# Patient Record
Sex: Female | Born: 1995 | Race: Black or African American | Hispanic: No | Marital: Single | Smoking: Never smoker
Health system: Southern US, Community
[De-identification: ages and names within clinical notes are randomized; demographics above are authoritative.]

## PROBLEM LIST (undated history)

## (undated) DIAGNOSIS — E119 Type 2 diabetes mellitus without complications: Secondary | ICD-10-CM

## (undated) DIAGNOSIS — I10 Essential (primary) hypertension: Secondary | ICD-10-CM

## (undated) HISTORY — DX: Type 2 diabetes mellitus without complications: E11.9

---
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
## 2015-06-27 ENCOUNTER — Encounter (HOSPITAL_COMMUNITY): Payer: Self-pay | Admitting: Family Medicine

## 2015-06-27 ENCOUNTER — Emergency Department (HOSPITAL_COMMUNITY)
Admission: EM | Admit: 2015-06-27 | Discharge: 2015-06-27 | Disposition: A | Discharge status: Home / Self Care | Payer: Medicaid Other | Attending: Emergency Medicine | Admitting: Emergency Medicine

## 2015-06-27 DIAGNOSIS — Z3202 Encounter for pregnancy test, result negative: Secondary | ICD-10-CM | POA: Insufficient documentation

## 2015-06-27 DIAGNOSIS — L80 Vitiligo: Secondary | ICD-10-CM | POA: Insufficient documentation

## 2015-06-27 DIAGNOSIS — R000 Tachycardia, unspecified: Secondary | ICD-10-CM | POA: Insufficient documentation

## 2015-06-27 DIAGNOSIS — D649 Anemia, unspecified: Secondary | ICD-10-CM | POA: Insufficient documentation

## 2015-06-27 DIAGNOSIS — R739 Hyperglycemia, unspecified: Secondary | ICD-10-CM | POA: Diagnosis not present

## 2015-06-27 DIAGNOSIS — J029 Acute pharyngitis, unspecified: Secondary | ICD-10-CM | POA: Diagnosis present

## 2015-06-27 LAB — CBC WITH DIFFERENTIAL/PLATELET
BASOS ABS: 0 10*3/uL (ref 0.0–0.1)
Basophils Relative: 0 %
Eosinophils Absolute: 0.1 10*3/uL (ref 0.0–0.7)
Eosinophils Relative: 1 %
HEMATOCRIT: 33 % — AB (ref 36.0–46.0)
Hemoglobin: 9.6 g/dL — ABNORMAL LOW (ref 12.0–15.0)
LYMPHS ABS: 1.7 10*3/uL (ref 0.7–4.0)
LYMPHS PCT: 35 %
MCH: 17.3 pg — ABNORMAL LOW (ref 26.0–34.0)
MCHC: 29.1 g/dL — ABNORMAL LOW (ref 30.0–36.0)
MCV: 59.6 fL — ABNORMAL LOW (ref 78.0–100.0)
MONOS PCT: 8 %
Monocytes Absolute: 0.4 10*3/uL (ref 0.1–1.0)
NEUTROS PCT: 56 %
Neutro Abs: 2.7 10*3/uL (ref 1.7–7.7)
Platelets: 274 10*3/uL (ref 150–400)
RBC: 5.54 MIL/uL — AB (ref 3.87–5.11)
RDW: 20 % — AB (ref 11.5–15.5)
WBC: 4.9 10*3/uL (ref 4.0–10.5)

## 2015-06-27 LAB — BASIC METABOLIC PANEL
ANION GAP: 15 (ref 5–15)
BUN: 9 mg/dL (ref 6–20)
CALCIUM: 9.9 mg/dL (ref 8.9–10.3)
CO2: 24 mmol/L (ref 22–32)
Chloride: 100 mmol/L — ABNORMAL LOW (ref 101–111)
Creatinine, Ser: 0.86 mg/dL (ref 0.44–1.00)
Glucose, Bld: 334 mg/dL — ABNORMAL HIGH (ref 65–99)
POTASSIUM: 3.9 mmol/L (ref 3.5–5.1)
Sodium: 139 mmol/L (ref 135–145)

## 2015-06-27 LAB — URINE MICROSCOPIC-ADD ON

## 2015-06-27 LAB — URINALYSIS, ROUTINE W REFLEX MICROSCOPIC
Bilirubin Urine: NEGATIVE
Glucose, UA: >1000 mg/dL — AB
KETONES UR: 40 mg/dL — AB
LEUKOCYTES UA: NEGATIVE
NITRITE: NEGATIVE
PROTEIN: 100 mg/dL — AB
Specific Gravity, Urine: >1.046 — ABNORMAL HIGH (ref 1.005–1.030)
pH: 6 (ref 5.0–8.0)

## 2015-06-27 LAB — HEPATIC FUNCTION PANEL
ALT: 20 U/L (ref 14–54)
AST: 29 U/L (ref 15–41)
Albumin: 4.4 g/dL (ref 3.5–5.0)
Alkaline Phosphatase: 132 U/L — ABNORMAL HIGH (ref 38–126)
BILIRUBIN DIRECT: 0.1 mg/dL (ref 0.1–0.5)
BILIRUBIN INDIRECT: 0.7 mg/dL (ref 0.3–0.9)
Total Bilirubin: 0.8 mg/dL (ref 0.3–1.2)
Total Protein: 8.9 g/dL — ABNORMAL HIGH (ref 6.5–8.1)

## 2015-06-27 LAB — CBG MONITORING, ED: Glucose-Capillary: 242 mg/dL — ABNORMAL HIGH (ref 65–99)

## 2015-06-27 LAB — I-STAT BETA HCG BLOOD, ED (MC, WL, AP ONLY): I-stat hCG, quantitative: <5 m[IU]/mL (ref <5)

## 2015-06-27 LAB — RAPID STREP SCREEN (MED CTR MEBANE ONLY): Streptococcus, Group A Screen (Direct): NEGATIVE

## 2015-06-27 LAB — MONONUCLEOSIS SCREEN: MONO SCREEN: NEGATIVE

## 2015-06-27 MED ORDER — SODIUM CHLORIDE 0.9 % IV BOLUS (SEPSIS)
1000.0000 mL | Freq: Once | INTRAVENOUS | Status: AC
  Administered 2015-06-27: 1000 mL via INTRAVENOUS

## 2015-06-27 MED ORDER — FERROUS SULFATE 325 (65 FE) MG PO TABS: 325.0000 mg | ORAL_TABLET | Freq: Three times a day (TID) | ORAL | Status: DC

## 2015-06-27 MED ORDER — FLUCONAZOLE 100 MG PO TABS
200.0000 mg | ORAL_TABLET | Freq: Once | ORAL | Status: AC
  Administered 2015-06-27: 200 mg via ORAL
  Filled 2015-06-27: qty 2

## 2015-06-27 MED ORDER — METFORMIN HCL 500 MG PO TABS: 500.0000 mg | ORAL_TABLET | Freq: Two times a day (BID) | ORAL | Status: DC

## 2015-06-27 NOTE — Care Management Note (Signed)
Case Management Note  Patient Details  Name: Morgan Moon MRN: [id number] Date of Birth: October 09, 1995  Subjective/Objective:           [facility]. Incidental findings elevated blood sugar.  Action/Plan:  Patient is without PCP for follow up. Patient has [place] Medicaid ,[facility] at 2[facility]. Teach back done, patient verbalized understanding. No further questions or concerns verbalized.   Expected Discharge Date:     06/27/15             Expected Discharge Plan:   [facility]   In-House Referral:     Discharge planning Services   Indigent clinic  Post Acute Care Choice:    Choice offered to:     DME Arranged:    DME Agency:     HH Arranged:    HH Agency:     Status of Service:   completed and signed off  Medicare Important Message Given:    Date Medicare IM Given:    Medicare IM give by:    Date Additional Medicare IM Given:    Additional Medicare Important Message give by:     If discussed at Long Length of Stay Meetings, dates discussed:    Additional CommentsMichel Moon:  Morgan Weimer, RN 06/27/2015, 3:44 PM

## 2015-06-27 NOTE — ED Notes (Signed)
Wanda, case manager, at bedside  

## 2015-06-27 NOTE — Discharge Instructions (Signed)
Please follow with your primary care doctor in the next 2 days for a check-up. They must obtain records for further management.  ° °Do not hesitate to return to the Emergency Department for any new, worsening or concerning symptoms.  ° °

## 2015-06-27 NOTE — ED Notes (Signed)
Pt here for sore throat for a few days. Throat painful and red. sts vomited once Thursday. Pt HR elevated in triage. Mom sts hx of anxiety.

## 2015-06-27 NOTE — ED Provider Notes (Signed)
CSN: [id number]     Arrival date & time 06/27/15  1128 History   First MD Initiated Contact with Patient 06/27/15 1206     Chief Complaint  Patient presents with  . Sore Throat     (Consider location/radiation/quality/duration/timing/severity/associated sxs/prior Treatment) HPI  Blood pressure 142/91, pulse 130, temperature 98.6 F (37 C), temperature source Oral, resp. rate 18, weight 54.205 kg, last menstrual period 06/21/2015, SpO2 100 %.  Morgan Moon is a 20 y.o. female complaining of sore throat onset 3 days ago, she rates it as severe, no pain medication taken prior to arrival. Mother thought it might be allergies and gave her some generic allergy medication with little relief. Patient denies rhinorrhea, otalgia, sinus pain pressure, fever, chills, cough, chest pain, shortness of breath, abdominal pain, change in bowel or bladder habits, rash, sick contacts. Patient also reports single episode of nonbloody, nonbilious, no coffee-ground emesis 2 days ago now resolved.  History reviewed. No pertinent past medical history. History reviewed. No pertinent past surgical history. History reviewed. No pertinent family history. Social History  Substance Use Topics  . Smoking status: Never Smoker   . Smokeless tobacco: None  . Alcohol Use: No   OB History    No data available     Review of Systems  10 systems reviewed and found to be negative, except as noted in the HPI.  Allergies  Review of patient's allergies indicates no known allergies.  Home Medications   Prior to Admission medications   Medication Sig Start Date End Date Taking? Authorizing Provider  ferrous sulfate 325 (65 FE) MG tablet Take 1 tablet (325 mg total) by mouth 3 (three) times daily with meals. 06/27/15   Rosina Cressler, PA-C  metFORMIN (GLUCOPHAGE) 500 MG tablet Take 1 tablet (500 mg total) by mouth 2 (two) times daily with a meal. 06/27/15   Brooklyn Jeff, PA-C   BP 145/96 mmHg  Pulse 109  Temp(Src)  98.6 F (37 C) (Oral)  Resp 15  Wt 54.205 kg  SpO2 100%  LMP 06/21/2015 Physical Exam  Constitutional: She is oriented to person, place, and time. She appears well-developed and well-nourished. No distress.  HENT:  Head: Normocephalic and atraumatic.  Right Ear: External ear normal.  Left Ear: External ear normal.  Mouth/Throat: Oropharynx is clear and moist. No oropharyngeal exudate.  No drooling or stridor. Posterior pharynx mildly erythematous no significant tonsillar hypertrophy. No exudate. Soft palate rises symmetrically. No TTP or induration under tongue.   No tenderness to palpation of frontal or bilateral maxillary sinuses.  Mild mucosal edema in the nares with scant rhinorrhea.  Bilateral tympanic membranes with normal architecture and good light reflex.    Eyes: Conjunctivae and EOM are normal. Pupils are equal, round, and reactive to light.  Neck: Normal range of motion. Neck supple.  Cardiovascular: Regular rhythm and intact distal pulses.   Mild tachycardia  Pulmonary/Chest: Effort normal and breath sounds normal. No stridor. No respiratory distress. She has no wheezes. She has no rales. She exhibits no tenderness.  Abdominal: Soft. There is no tenderness. There is no rebound and no guarding.  Musculoskeletal: Normal range of motion.  Neurological: She is alert and oriented to person, place, and time.  Skin: She is not diaphoretic.  Vitiligo  Psychiatric: She has a normal mood and affect.  Nursing note and vitals reviewed.   ED Course  Procedures (including critical care time) Labs Review Labs Reviewed  CBC WITH DIFFERENTIAL/PLATELET - Abnormal; Notable for the following:  RBC 5.54 (*)    Hemoglobin 9.6 (*)    HCT 33.0 (*)    MCV 59.6 (*)    MCH 17.3 (*)    MCHC 29.1 (*)    RDW 20.0 (*)    All other components within normal limits  BASIC METABOLIC PANEL - Abnormal; Notable for the following:    Chloride 100 (*)    Glucose, Bld 334 (*)    All other  components within normal limits  HEPATIC FUNCTION PANEL - Abnormal; Notable for the following:    Total Protein 8.9 (*)    Alkaline Phosphatase 132 (*)    All other components within normal limits  URINALYSIS, ROUTINE W REFLEX MICROSCOPIC (NOT AT [facility]) - Abnormal; Notable for the following:    Specific Gravity, Urine >1.046 (*)    Glucose, UA >1000 (*)    Hgb urine dipstick TRACE (*)    Ketones, ur 40 (*)    Protein, ur 100 (*)    All other components within normal limits  URINE MICROSCOPIC-ADD ON - Abnormal; Notable for the following:    Squamous Epithelial / LPF 0-5 (*)    Bacteria, UA RARE (*)    Casts GRANULAR CAST (*)    All other components within normal limits  CBG MONITORING, ED - Abnormal; Notable for the following:    Glucose-Capillary 242 (*)    All other components within normal limits  RAPID STREP SCREEN (NOT AT [facility])  CULTURE, GROUP A STREP ([facility])  MONONUCLEOSIS SCREEN  HEMOGLOBIN A1C  I-STAT BETA HCG BLOOD, ED ([facility], [facility], [facility] ONLY)    Imaging Review No results found. I have personally reviewed and evaluated these images and lab results as part of my medical decision-making.   EKG Interpretation   Date/Time:  Sunday June 27 2015 13:42:00 EDT Ventricular Rate:  110 PR Interval:  138 QRS Duration: 83 QT Interval:  321 QTC Calculation: 434 R Axis:   61 Text Interpretation:  Sinus tachycardia Abnormal T, consider ischemia,  diffuse leads Baseline wander in lead(s) V3 V4 V5 No previous ECGs  available Confirmed by YAO  MD, DAVID ([id number]) on 06/27/2015 2:01:45 PM Also  confirmed by Silverio Lay  MD, DAVID ([id number]), editor Dan Humphreys, CCT, SANDRA [phone])   on 06/27/2015 2:45:36 PM      MDM   Final diagnoses:  Hyperglycemia without ketosis  Anemia, unspecified anemia type  Pharyngitis    Filed Vitals:   06/27/15 1315 06/27/15 1345 06/27/15 1400 06/27/15 1415  BP: 142/90 149/96 114/72 145/96  Pulse: 109 110 106 109  Temp:      TempSrc:      Resp:  Weight:       SpO2: 100% 100% 100% 100%    Medications  fluconazole (DIFLUCAN) tablet 200 mg (not administered)  sodium chloride 0.9 % bolus 1,000 mL (0 mLs Intravenous Stopped 06/27/15 1321)    Morgan Moon is 20 y.o. female presenting with Isolated sore throat onset 2 days ago nonresponsive to allergy medications. Patient with no tonsillar hypertrophy, handling her secretions without issue.   This patient's blood glucose is elevated at 334, she has a normal anion gap. On discussions with this patient she does have a polyuria and polydipsia. She is also anemic with a hemoglobin of 9.6/33. We'll draw an A1c to be followed up by her primary care physician It appears that she has very heavy menses. Patient does not have outpatient primary care provider, case management consult in place to arrange this.  As per patient's mother she has a developmental delay and operates at the third grade level. Will check a urinalysis, think her urinary frequency is likely just secondary to her hyperglycemia.  Patient's urinalysis without signs of infection however, it is quite concentrated with 40 ketones and yeast is present. She given 200 mg of 2, surgical, she will follow with  Evaluation does not show pathology that would require ongoing emergent intervention or inpatient treatment. Pt is hemodynamically stable and mentating appropriately. Discussed findings and plan with patient/guardian, who agrees with care plan. All questions answered. Return precautions discussed and outpatient follow up given.   New Prescriptions   FERROUS SULFATE 325 (65 FE) MG TABLET    Take 1 tablet (325 mg total) by mouth 3 (three) times daily with meals.   METFORMIN (GLUCOPHAGE) 500 MG TABLET    Take 1 tablet (500 mg total) by mouth 2 (two) times daily with a meal.         Wynetta Emeryicole Marchell Froman, PA-C 06/27/15 1558  Richardean Canalavid H Yao, MD 06/27/15 1600

## 2015-06-28 ENCOUNTER — Inpatient Hospital Stay: Payer: Medicaid Other

## 2015-06-28 LAB — HEMOGLOBIN A1C
HEMOGLOBIN A1C: 10.5 % — AB (ref 4.8–5.6)
Mean Plasma Glucose: 255 mg/dL

## 2015-06-29 LAB — CULTURE, GROUP A STREP (THRC)

## 2015-06-30 ENCOUNTER — Telehealth: Payer: Self-pay | Admitting: *Deleted

## 2015-06-30 ENCOUNTER — Ambulatory Visit: Payer: Medicaid Other | Attending: Internal Medicine | Admitting: Internal Medicine

## 2015-06-30 ENCOUNTER — Encounter: Payer: Self-pay | Admitting: Internal Medicine

## 2015-06-30 VITALS — BP 154/79 | HR 102 | Temp 98.1°F | Resp 18 | Ht 66.0 in | Wt 124.0 lb

## 2015-06-30 DIAGNOSIS — E108 Type 1 diabetes mellitus with unspecified complications: Secondary | ICD-10-CM | POA: Diagnosis not present

## 2015-06-30 DIAGNOSIS — IMO0002 Reserved for concepts with insufficient information to code with codable children: Secondary | ICD-10-CM | POA: Insufficient documentation

## 2015-06-30 DIAGNOSIS — L80 Vitiligo: Secondary | ICD-10-CM | POA: Insufficient documentation

## 2015-06-30 DIAGNOSIS — R011 Cardiac murmur, unspecified: Secondary | ICD-10-CM | POA: Diagnosis not present

## 2015-06-30 DIAGNOSIS — E1065 Type 1 diabetes mellitus with hyperglycemia: Secondary | ICD-10-CM | POA: Insufficient documentation

## 2015-06-30 DIAGNOSIS — E109 Type 1 diabetes mellitus without complications: Secondary | ICD-10-CM | POA: Diagnosis not present

## 2015-06-30 DIAGNOSIS — IMO0001 Reserved for inherently not codable concepts without codable children: Secondary | ICD-10-CM
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
## 2015-06-30 MED ORDER — ACCU-CHEK AVIVA PLUS W/DEVICE KIT: 1.0000 | PACK | Freq: Three times a day (TID) | Status: DC

## 2015-06-30 MED ORDER — GLUCOSE BLOOD VI STRP: ORAL_STRIP | Status: DC

## 2015-06-30 MED ORDER — ACCU-CHEK SOFTCLIX LANCET DEV MISC: Status: DC

## 2015-06-30 MED ORDER — INSULIN NPH ISOPHANE & REGULAR (70-30) 100 UNIT/ML ~~LOC~~ SUSP: 10.0000 [IU] | Freq: Two times a day (BID) | SUBCUTANEOUS | Status: DC

## 2015-06-30 MED FILL — ACCU-CHEK SOFTCLIX LANCETS: 30 days supply | Qty: 100 | Fill #0

## 2015-06-30 MED FILL — ACCU-CHEK AVIVA PLUS METER: W/DEVICE | 1 days supply | Qty: 1 | Fill #0

## 2015-06-30 MED FILL — ACCU-CHEK AVIVA PLUS TEST S: 30 days supply | Qty: 100 | Fill #0

## 2015-06-30 NOTE — Progress Notes (Signed)
Patient is here for ED FU for Elevated Blood Sugar  Patient denies pain at this time.  Patient has taken medications this morning and patient has eaten breakfast.  [facility] note Patient comes in for f/u from ED- had phayngitis and noted to have elevated blood sugar with a1c > 10.  She has not had polyuria or polydipsia or polyphagia. No fhx. She does have vitiligo  Reviewed recent labs.  BP 154/79 mmHg  Pulse 102  Temp(Src) 98.1 F (36.7 C) (Oral)  Resp 18  Ht 5\' 6"  (1.676 m)  Wt 124 lb (56.246 kg)  BMI 20.02 kg/m2  SpO2 100%  LMP 06/21/2015 nad THIN Vitiligo Chest- CTA CV reg rate with 3/6 sem   DM (diabetes mellitus), type 1, uncontrolled (HCC) i suspect she has DM1- i need to prove that Stop metformin. Will start 70/30 insulin 10 units before breakfast and dinner Check blood sugars 4 times daily  Will check labs    Systolic murmur Unusual for age i wonder about biscupid valve. Needs evaluation

## 2015-06-30 NOTE — Assessment & Plan Note (Signed)
i suspect she has DM1- i need to prove that Stop metformin. Will start 70/30 insulin 10 units before breakfast and dinner Check blood sugars 4 times daily  Will check labs

## 2015-06-30 NOTE — Telephone Encounter (Signed)
Patient verified DOB Patient is aware of Echocardiogram being scheduled for Tuesday 07/13/15 at 10:00am. Patient noted the appointment in her phone. Patients mother is also aware of the appointment. No further questions at this time.

## 2015-06-30 NOTE — Addendum Note (Signed)
Addended by: Margaretmary LombardLISBON, Tomi Grandpre K on: 06/30/2015 03:33 PM   Modules accepted: Orders

## 2015-06-30 NOTE — Assessment & Plan Note (Signed)
Unusual for age i wonder about biscupid valve. Needs evaluation

## 2015-07-01 LAB — C-PEPTIDE: C PEPTIDE: 1.32 ng/mL (ref 0.80–3.85)

## 2015-07-01 LAB — INSULIN, RANDOM: INSULIN: 8.9 u[IU]/mL (ref 2.0–19.6)

## 2015-07-02 LAB — GLUTAMIC ACID DECARBOXYLASE AUTO ABS

## 2015-07-07 ENCOUNTER — Ambulatory Visit: Payer: Medicaid Other | Attending: Family Medicine | Admitting: Pharmacist

## 2015-07-07 ENCOUNTER — Encounter: Payer: Self-pay | Admitting: Pharmacist

## 2015-07-07 DIAGNOSIS — E1065 Type 1 diabetes mellitus with hyperglycemia: Principal | ICD-10-CM

## 2015-07-07 DIAGNOSIS — E109 Type 1 diabetes mellitus without complications: Secondary | ICD-10-CM

## 2015-07-07 DIAGNOSIS — IMO0001 Reserved for inherently not codable concepts without codable children: Secondary | ICD-10-CM

## 2015-07-07 MED ORDER — INSULIN PEN NEEDLE 31G X 5 MM MISC
Status: DC
Start: 2015-07-07 — End: 2016-02-01

## 2015-07-07 MED ORDER — FERROUS SULFATE 325 (65 FE) MG PO TABS: 325.0000 mg | ORAL_TABLET | Freq: Three times a day (TID) | ORAL | Status: DC

## 2015-07-07 MED ORDER — INSULIN ISOPHANE & REGULAR (HUMAN 70-30)100 UNIT/ML KWIKPEN: 12.0000 [IU] | PEN_INJECTOR | Freq: Two times a day (BID) | SUBCUTANEOUS | Status: DC

## 2015-07-07 MED FILL — BD PEN NDL SHORT 31GX5/16: 31G X 8 MM | 30 days supply | Qty: 100 | Fill #0

## 2015-07-07 NOTE — Progress Notes (Signed)
S:    Patient arrives in good spirits with her mom.  Presents for diabetes evaluation, education, and management at the request of Dr. Cato MulliganSwords. Patient was referred on 06/30/15.  Diabetes is newly diagnosed as Type 1 per Dr. Cato MulliganSwords.  Patient reports adherence with medications.  Current diabetes medications include: Novolin 70/30 10 units BID. Patient's mother is interested in using a pen device instead of the vial and syringe. Her daughter has a developmental delay and would do better with a pen device.  Patient denies hypoglycemic events.  Patient reported dietary habits: Eats 3 meals/day. Typically has eats fast food and a lot of processed foods. Her mother is trying to get her to eat healthier.   Patient reported exercise habits: none   Patient reports nocturia.  Patient denies neuropathy. Patient denies visual changes. Patient reports self foot exams.    O:  Lab Results  Component Value Date   HGBA1C 10.5* 06/27/2015   There were no vitals filed for this visit.  Home fasting CBG: 100s-180s 2 hour post-prandial/random CBG: 170s-250s   A/P: Type 1 Diabetes newly diagnosed currently uncontrolled based on A1c of 10.5 and home CBGs. Patient denies hypoglycemic events and is able to verbalize appropriate hypoglycemia management plan. Patient reports adherence with medication. Control is suboptimal due to need for increased insulin dose.  Will switch to 70/30 pen - Humulin is the preferred brand for Medicaid. Will also slightly increase the dose to 12 units BID for improved control. Discussed the option of basal and bolus but patient and mother would prefer to continue to use mixed insulin for ease of use. Extensive education provided on type 1 diabetes, blood glucose monitoring, basic carbohydrate counseling, hypoglycemia, and insulin pen use.   Next A1C anticipated July 2017.    Written patient instructions provided.  Total time in face to face counseling 30 minutes.  Follow up in  Pharmacist Clinic Visit as needed. Needs to be established with a primary care provider here.   Patient seen with Theresa MulliganMartin Shaughnessy, PharmD Candidate

## 2015-07-07 NOTE — Patient Instructions (Addendum)
Thanks for coming to see us!  Increase your insulin to 12 units twice a day  Call us immediately with any blood sugar reading less than 80  Schedule an appointment to be established with a primary care provider here.  Hypoglycemia Low blood sugar (hypoglycemia) means that the level of sugar in your blood is lower than it should be. Signs of low blood sugar include:  Getting sweaty.  Feeling hungry.  Feeling dizzy or weak.  Feeling sleepier than normal.  Feeling nervous.  Headaches.  Having a fast heartbeat. Low blood sugar can happen fast and can be an emergency. Your doctor can do tests to check your blood sugar level. You can have low blood sugar and not have diabetes. HOME CARE  Check your blood sugar as told by your doctor. If it is less than 70 mg/dl or as told by your doctor, take 1 of the following:  3 to 4 glucose tablets.   cup clear juice.   cup soda pop, not diet.  1 cup milk.  5 to 6 hard candies.  Recheck blood sugar after 15 minutes. Repeat until it is at the right level.  Eat a snack if it is more than 1 hour until the next meal.  Only take medicine as told by your doctor.  Do not skip meals. Eat on time.  Do not drink alcohol except with meals.  Check your blood glucose before driving.  Check your blood glucose before and after exercise.  Always carry treatment with you, such as glucose pills.  Always wear a medical alert bracelet if you have diabetes. GET HELP RIGHT AWAY IF:   Your blood glucose goes below 70 mg/dl or as told by your doctor, and you:  Are confused.  Are not able to swallow.  Pass out (faint).  You cannot treat yourself. You may need someone to help you.  You have low blood sugar problems often.  You have problems from your medicines.  You are not feeling better after 3 to 4 days.  You have vision changes. MAKE SURE YOU:   Understand these instructions.  Will watch this condition.  Will get help right  away if you are not doing well or get worse.   This information is not intended to replace advice given to you by your health care provider. Make sure you discuss any questions you have with your health care provider.   Document Released: 05/31/2009 Document Revised: 03/27/2014 Document Reviewed: 11/10/2014 Elsevier Interactive Patient Education [employer]2016 [employer] Inc.

## 2015-07-08 MED FILL — HUMULIN 70/30 KWIKPEN: (70-30) 100 | 25 days supply | Qty: 6 | Fill #0

## 2015-07-13 ENCOUNTER — Ambulatory Visit (HOSPITAL_COMMUNITY): Payer: Medicaid Other

## 2015-07-23 ENCOUNTER — Telehealth: Payer: Self-pay | Admitting: *Deleted

## 2015-07-23 NOTE — Telephone Encounter (Signed)
Our office has made several call to reschedule [name] echocardiogram on 4/24,4/26 and 07/23/15. Ms Roderic Scarceaton mother said she will call back in a few days.

## 2015-07-30 MED FILL — ACCU-CHEK AVIVA PLUS TEST S: 30 days supply | Qty: 100 | Fill #1

## 2015-08-03 MED FILL — HUMULIN 70/30 KWIKPEN: (70-30) 100 | 25 days supply | Qty: 6 | Fill #1

## 2015-08-03 MED FILL — BD PEN NDL SHORT 31GX5/16: 31G X 8 MM | 30 days supply | Qty: 100 | Fill #1

## 2015-08-04 ENCOUNTER — Other Ambulatory Visit: Payer: Self-pay | Admitting: Internal Medicine

## 2015-08-27 ENCOUNTER — Other Ambulatory Visit: Payer: Self-pay | Admitting: Internal Medicine

## 2015-08-27 MED FILL — HUMULIN 70/30 KWIKPEN: (70-30) 100 | 25 days supply | Qty: 6 | Fill #2

## 2015-09-01 ENCOUNTER — Other Ambulatory Visit: Payer: Self-pay | Admitting: Internal Medicine

## 2015-09-10 MED FILL — ACCU-CHEK AVIVA PLUS TEST S: 30 days supply | Qty: 100 | Fill #2

## 2015-09-14 MED FILL — HUMULIN 70/30 KWIKPEN: (70-30) 100 | 25 days supply | Qty: 6 | Fill #3

## 2015-09-27 ENCOUNTER — Ambulatory Visit: Payer: Medicaid Other | Admitting: Internal Medicine

## 2015-10-18 ENCOUNTER — Telehealth (HOSPITAL_COMMUNITY): Payer: Self-pay | Admitting: Internal Medicine

## 2015-10-18 NOTE — Telephone Encounter (Signed)
Called [facility] and spoke with Randa Evens about removing the patient from the workqueue and Joann informed me that the patient had not been seen in their office. This must have been a outpatient procedure needed as follow up from the hospital. Randa Evens also stated that Dr. Birdie Sons was not longer with the office.

## 2015-10-20 MED FILL — BD PEN NDL SHORT 31GX5/16: 31G X 8 MM | 30 days supply | Qty: 100 | Fill #2 | Status: TO

## 2015-10-20 MED FILL — HUMULIN 70/30 KWIKPEN: (70-30) 100 | 25 days supply | Qty: 6 | Fill #4 | Status: TO

## 2015-10-20 MED FILL — ACCU-CHEK AVIVA PLUS TEST S: 30 days supply | Qty: 100 | Fill #3 | Status: TO

## 2015-10-26 ENCOUNTER — Other Ambulatory Visit: Payer: Self-pay | Admitting: Internal Medicine

## 2015-10-26 DIAGNOSIS — E109 Type 1 diabetes mellitus without complications: Secondary | ICD-10-CM

## 2015-10-27 ENCOUNTER — Other Ambulatory Visit: Payer: Self-pay | Admitting: Internal Medicine

## 2015-10-27 ENCOUNTER — Telehealth: Payer: Self-pay | Admitting: General Practice

## 2015-10-27 DIAGNOSIS — E109 Type 1 diabetes mellitus without complications: Secondary | ICD-10-CM

## 2015-10-27 MED ORDER — ACCU-CHEK SOFTCLIX LANCETS MISC: 0 refills | Status: DC

## 2015-10-27 NOTE — Telephone Encounter (Signed)
I refilled patient's lancets - she needs an office visit for any further refills.

## 2015-10-27 NOTE — Telephone Encounter (Signed)
Pt. Mother called requesting a refill on [employer] (ACCU-CHEK SOFTCLIX) lancets. Please f/u

## 2015-12-31 ENCOUNTER — Other Ambulatory Visit: Payer: Self-pay | Admitting: Internal Medicine

## 2015-12-31 DIAGNOSIS — E109 Type 1 diabetes mellitus without complications: Secondary | ICD-10-CM

## 2016-02-01 ENCOUNTER — Other Ambulatory Visit: Payer: Self-pay | Admitting: Pharmacist

## 2016-02-01 MED ORDER — INSULIN PEN NEEDLE 31G X 5 MM MISC: 0 refills | Status: DC

## 2016-02-01 MED ORDER — INSULIN ISOPHANE & REGULAR (HUMAN 70-30)100 UNIT/ML KWIKPEN: 12.0000 [IU] | PEN_INJECTOR | Freq: Two times a day (BID) | SUBCUTANEOUS | 0 refills | Status: DC

## 2016-02-01 NOTE — Telephone Encounter (Signed)
Refilled insulin and needles - patient must have office visit for refills.

## 2016-03-10 ENCOUNTER — Ambulatory Visit: Payer: Medicaid Other | Attending: Family Medicine | Admitting: Family Medicine

## 2016-03-10 ENCOUNTER — Encounter: Payer: Self-pay | Admitting: Family Medicine

## 2016-03-10 VITALS — BP 138/84 | HR 107 | Temp 98.2°F | Resp 18 | Ht 66.0 in | Wt 132.8 lb

## 2016-03-10 DIAGNOSIS — Z8249 Family history of ischemic heart disease and other diseases of the circulatory system: Secondary | ICD-10-CM | POA: Insufficient documentation

## 2016-03-10 DIAGNOSIS — R42 Dizziness and giddiness: Secondary | ICD-10-CM | POA: Insufficient documentation

## 2016-03-10 DIAGNOSIS — I10 Essential (primary) hypertension: Secondary | ICD-10-CM | POA: Diagnosis not present

## 2016-03-10 DIAGNOSIS — IMO0001 Reserved for inherently not codable concepts without codable children: Secondary | ICD-10-CM

## 2016-03-10 DIAGNOSIS — E109 Type 1 diabetes mellitus without complications: Secondary | ICD-10-CM | POA: Diagnosis not present

## 2016-03-10 DIAGNOSIS — E1065 Type 1 diabetes mellitus with hyperglycemia: Secondary | ICD-10-CM | POA: Diagnosis present

## 2016-03-10 LAB — GLUCOSE, POCT (MANUAL RESULT ENTRY): POC Glucose: 87 mg/dl (ref 70–99)

## 2016-03-10 LAB — BASIC METABOLIC PANEL
BUN: 8 mg/dL (ref 7–25)
CO2: 28 mmol/L (ref 20–31)
Calcium: 9.3 mg/dL (ref 8.6–10.2)
Chloride: 106 mmol/L (ref 98–110)
Creat: 0.72 mg/dL (ref 0.50–1.10)
GLUCOSE: 76 mg/dL (ref 65–99)
POTASSIUM: 4.3 mmol/L (ref 3.5–5.3)
SODIUM: 139 mmol/L (ref 135–146)

## 2016-03-10 LAB — POCT GLYCOSYLATED HEMOGLOBIN (HGB A1C): HEMOGLOBIN A1C: 5.8

## 2016-03-10 MED ORDER — INSULIN ISOPHANE & REGULAR (HUMAN 70-30)100 UNIT/ML KWIKPEN: 10.0000 [IU] | PEN_INJECTOR | Freq: Two times a day (BID) | SUBCUTANEOUS | 2 refills | Status: DC

## 2016-03-10 MED ORDER — ACCU-CHEK SOFTCLIX LANCETS MISC: 11 refills | Status: DC

## 2016-03-10 MED ORDER — AMLODIPINE BESYLATE 2.5 MG PO TABS: 5.0000 mg | ORAL_TABLET | Freq: Every day | ORAL | 2 refills | Status: DC

## 2016-03-10 MED ORDER — GLUCOSE BLOOD VI STRP: ORAL_STRIP | 11 refills | Status: DC

## 2016-03-10 MED ORDER — LISINOPRIL 5 MG PO TABS: 5.0000 mg | ORAL_TABLET | Freq: Every day | ORAL | 2 refills | Status: DC

## 2016-03-10 NOTE — Progress Notes (Signed)
Subjective:   Chief Complaint  Patient presents with  . Diabetes   HPI Morgan Moon 20 y.o. female comes to the office to establish care for diabetes. She reports being diagnosed with DM 1 at 20 yrs old. She denies any polydipsia, polyuria, or polyphagia. She denies any blurred vision or vision changes. She does report lightheadedness in the evening. She monitors her blood glucose at home and brings her meter to this visit. Her am recent CBG were noted to be low with the lowest being 59. She reports eating meals in the am. She denies any numbness, burning, or tingling to the extremities. She denies any foot sores or cracking. BP was elevated this visit. Pt.reports history of elevated BP's. She denies any CP, SOB, or swelling of the extremities.     Past Medical History:  Diagnosis Date  . Diabetes mellitus without complication ([place])    History reviewed. No pertinent surgical history.  Family History  Problem Relation Age of Onset  . Hypertension Maternal Aunt   . Hypertension Maternal Uncle   . Hypertension Maternal Grandmother   . Hypertension Maternal Grandfather    Social History   Social History  . Marital status: Single    Spouse name: N/A  . Number of children: N/A  . Years of education: N/A   Social History Main Topics  . Smoking status: Never Smoker  . Smokeless tobacco: Never Used  . Alcohol use No  . Drug use: No  . Sexual activity: Not on file    Outpatient Medications Prior to Visit  Medication Sig Dispense Refill  . Blood Glucose Monitoring Suppl (ACCU-CHEK AVIVA PLUS) w/Device KIT 1 each by Does not apply route 3 (three) times daily. 1 kit 0  . ferrous sulfate 325 (65 FE) MG tablet Take 1 tablet (325 mg total) by mouth 3 (three) times daily with meals. 90 tablet 0  . Insulin Pen Needle 31G X 5 MM MISC Use as directed for twice daily insulin dosing 100 each 0  . Lancet Devices (ACCU-CHEK SOFTCLIX) lancets Use as instructed 1 each 0  . ACCU-CHEK SOFTCLIX  LANCETS lancets USE AS INSTRUCTED 100 each 0  . glucose blood (ACCU-CHEK AVIVA) test strip Use as instructed 100 each 12  . Insulin Isophane & Regular Human (HUMULIN 70/30 KWIKPEN) (70-30) 100 UNIT/ML PEN Inject 12 Units into the skin 2 (two) times daily. 15 mL 0   No facility-administered medications prior to visit.    No Known Allergies  Review of Systems  Constitutional: Negative.   Eyes: Negative.  Negative for blurred vision.  Respiratory: Negative.   Cardiovascular: Negative.   Gastrointestinal: Negative.   Skin: Negative.   Neurological: Negative for dizziness and tingling.  Endo/Heme/Allergies: Negative for polydipsia.     Objective:    Physical Exam  Constitutional: She is oriented to person, place, and time. She appears well-developed and well-nourished.  Eyes: Pupils are equal, round, and reactive to light. No scleral icterus.  Neck: No JVD present.  Cardiovascular: Normal rate, regular rhythm and normal heart sounds.   Pulmonary/Chest: Effort normal and breath sounds normal.  Abdominal: Soft. Bowel sounds are normal. She exhibits no distension and no mass. There is no tenderness.  Neurological: She is alert and oriented to person, place, and time.  Skin: Skin is warm and dry.  Psychiatric: She has a normal mood and affect. Her behavior is normal. Thought content normal.    BP 138/84 (BP Location: Left Arm, Patient Position: Sitting, Cuff Size: Normal)  Pulse (!) 107   Temp 98.2 F (36.8 C) (Oral)   Resp 18   Ht '5\' 6"'$  (1.676 m)   Wt 132 lb 12.8 oz (60.2 kg)   LMP 03/09/2016   SpO2 100%   BMI 21.43 kg/m  Wt Readings from Last 3 Encounters:  03/10/16 132 lb 12.8 oz (60.2 kg)  06/30/15 124 lb (56.2 kg) (43 %, Z= -0.17)*  06/27/15 119 lb 8 oz (54.2 kg) (34 %, Z= -0.41)*   * Growth percentiles are based on CDC 2-20 Years data.    Diabetic Foot Exam - Simple   Monofilament exam wnl.     Lab Results  Component Value Date   HGBA1C 5.8 03/10/2016   HGBA1C  10.5 (H) 06/27/2015     Assessment & Plan:   Problem List Items Addressed This Visit      Endocrine   DM (diabetes mellitus), type 1, uncontrolled ([place]) - Primary   Relevant Medications   Insulin Isophane & Regular Human (HUMULIN 70/30 KWIKPEN) (70-30) 100 UNIT/ML PEN   ACCU-CHEK SOFTCLIX LANCETS lancets   glucose blood (ACCU-CHEK AVIVA) test strip   Other Relevant Orders   Glucose (CBG) (Completed)   HgB A1c (Completed)   Microalbumin / creatinine urine ratio   Basic Metabolic Panel   POCT urinalysis dipstick   Ambulatory referral to Ophthalmology    Other Visit Diagnoses    Essential hypertension       -Patient unable to void ACE/ARB anti-hypertensive will not be prescribed at this time.   Relevant Medications   amLODipine (NORVASC) 2.5 MG tablet   -Come back in 1 week for BP check with nurse.   Type 1 diabetes mellitus without complication ([facility])       Relevant Medications   Insulin Isophane & Regular Human (HUMULIN 70/30 KWIKPEN) (70-30) 100 UNIT/ML PEN   glucose blood (ACCU-CHEK AVIVA) test strip   -Come back in 1 week for glucose check with nurse.   Other Relevant Orders   Lipid Panel     I have discontinued Ms. Bartleson's lisinopril. I have also changed her Insulin Isophane & Regular Human. Additionally, I am having her start on amLODipine. Lastly, I am having her maintain her accu-chek softclix, ACCU-CHEK AVIVA PLUS, ferrous sulfate, Insulin Pen Needle, ACCU-CHEK SOFTCLIX LANCETS, and glucose blood.  Meds ordered this encounter  Medications  . DISCONTD: lisinopril (PRINIVIL,ZESTRIL) 5 MG tablet    Sig: Take 1 tablet (5 mg total) by mouth daily.    Dispense:  30 tablet    Refill:  2    Order Specific Question:   Supervising Provider    Answer:   Tresa Garter [id number]  . Insulin Isophane & Regular Human (HUMULIN 70/30 KWIKPEN) (70-30) 100 UNIT/ML PEN    Sig: Inject 10 Units into the skin 2 (two) times daily.    Dispense:  15 mL    Refill:  2    E10.9     Order Specific Question:   Supervising Provider    Answer:   Tresa Garter [id number]  . ACCU-CHEK SOFTCLIX LANCETS lancets    Sig: USE AS INSTRUCTED    Dispense:  100 each    Refill:  11    Order Specific Question:   Supervising Provider    Answer:   Tresa Garter [[id number]]  . glucose blood (ACCU-CHEK AVIVA) test strip    Sig: Use as instructed    Dispense:  100 each    Refill:  11    Order Specific  Question:   Supervising Provider    Answer:   Tresa Garter [[id number]]  . amLODipine (NORVASC) 2.5 MG tablet    Sig: Take 2 tablets (5 mg total) by mouth daily.    Dispense:  30 tablet    Refill:  2    Order Specific Question:   Supervising Provider    Answer:   Tresa Garter [[id number]]    Fredia Beets, FNP

## 2016-03-10 NOTE — Patient Instructions (Signed)
Follow up in 1 week for BP check and glucose check.  Hypertension Hypertension is another name for high blood pressure. High blood pressure forces your heart to work harder to pump blood. A blood pressure reading has two numbers, which includes a higher number over a lower number (example: 110/72). Follow these instructions at home:  Have your blood pressure rechecked by your doctor.  Only take medicine as told by your doctor. Follow the directions carefully. The medicine does not work as well if you skip doses. Skipping doses also puts you at risk for problems.  Do not smoke.  Monitor your blood pressure at home as told by your doctor. Contact a doctor if:  You think you are having a reaction to the medicine you are taking.  You have repeat headaches or feel dizzy.  You have puffiness (swelling) in your ankles.  You have trouble with your vision. Get help right away if:  You get a very bad headache and are confused.  You feel weak, numb, or faint.  You get chest or belly (abdominal) pain.  You throw up (vomit).  You cannot breathe very well. This information is not intended to replace advice given to you by your health care provider. Make sure you discuss any questions you have with your health care provider. Document Released: 08/23/2007 Document Revised: 08/12/2015 Document Reviewed: 12/27/2012 Elsevier Interactive Patient Education  2017 Elsevier Inc. Type 1 Diabetes Mellitus, Diagnosis, Adult Type 1 diabetes (type 1 diabetes mellitus) is a long-term (chronic) disease. It happens when your body does not make enough of a hormone called insulin. Insulin lets sugars (glucose) go into cells in the body. This gives you energy. If your body does not make enough insulin, sugars cannot get into cells. This causes high blood sugar (hyperglycemia). Your doctor will set treatment goals for you. Generally, you should have these blood sugar levels:  Before meals (preprandial): 80-130  mg/dL (2.8-4.14.4-7.2 mmol/L).  After meals (postprandial): below 180 mg/dL (10 mmol/L).  A1c (hemoglobin A1c) level: less than 7%. Follow these instructions at home: Questions to Ask Your Doctor  You may want to ask these questions:  Do I need to meet with a diabetes educator?  Where can I find a support group for people with diabetes?  What equipment will I need to care for myself at home?  What diabetes medicines do I need? When should I take them?  How often do I need to check my blood sugar?  What number can I call if I have questions?  When is my next doctor's visit? General instructions  Take over-the-counter and prescription medicines only as told by your doctor.  Keep all follow-up visits as told by your doctor. This is important. Contact a doctor if:  Your blood sugar is at or above 240 mg/dL (32.413.3 mmol/L) for 2 days in a row.  You have been sick or have had a fever for 2 days or more, and you are not getting better.  You have any of these problems for more than 6 hours:  You cannot eat or drink.  You feel sick to your stomach (nauseous).  You throw up (vomit).  You have watery poop (diarrhea). Get help right away if:  Your blood sugar is lower than 54 mg/dL (3 mmol/L).  You get confused.  You have trouble:  Thinking clearly.  Breathing.  You have moderate or large ketone levels in your pee (urine). This information is not intended to replace advice given to you  by your health care provider. Make sure you discuss any questions you have with your health care provider. Document Released: 06/28/2015 Document Revised: 08/12/2015 Document Reviewed: 04/09/2015 Elsevier Interactive Patient Education  2017 [employer]

## 2016-03-15 ENCOUNTER — Other Ambulatory Visit: Payer: Self-pay | Admitting: Family Medicine

## 2016-03-15 ENCOUNTER — Other Ambulatory Visit: Payer: Self-pay | Admitting: Internal Medicine

## 2016-03-15 ENCOUNTER — Telehealth: Payer: Self-pay | Admitting: Family Medicine

## 2016-03-15 ENCOUNTER — Encounter: Payer: Self-pay | Admitting: Family Medicine

## 2016-03-15 DIAGNOSIS — E1065 Type 1 diabetes mellitus with hyperglycemia: Principal | ICD-10-CM

## 2016-03-15 DIAGNOSIS — I10 Essential (primary) hypertension: Secondary | ICD-10-CM

## 2016-03-15 DIAGNOSIS — E109 Type 1 diabetes mellitus without complications: Secondary | ICD-10-CM

## 2016-03-15 DIAGNOSIS — IMO0001 Reserved for inherently not codable concepts without codable children: Secondary | ICD-10-CM

## 2016-03-15 MED ORDER — INSULIN PEN NEEDLE 31G X 5 MM MISC: 0 refills | Status: DC

## 2016-03-15 MED ORDER — AMLODIPINE BESYLATE 2.5 MG PO TABS: 5.0000 mg | ORAL_TABLET | Freq: Every day | ORAL | 2 refills | Status: DC

## 2016-03-15 MED ORDER — ACCU-CHEK SOFTCLIX LANCETS MISC: 11 refills | Status: DC

## 2016-03-15 MED ORDER — INSULIN ISOPHANE & REGULAR (HUMAN 70-30)100 UNIT/ML KWIKPEN: 10.0000 [IU] | PEN_INJECTOR | Freq: Two times a day (BID) | SUBCUTANEOUS | 2 refills | Status: DC

## 2016-03-15 MED ORDER — GLUCOSE BLOOD VI STRP: ORAL_STRIP | 11 refills | Status: DC

## 2016-03-15 MED ORDER — ACCU-CHEK SOFTCLIX LANCET DEV MISC: 0 refills | Status: DC

## 2016-03-15 NOTE — Telephone Encounter (Signed)
Medication has be re-ordered and [facility] the [facility] has [facility].

## 2016-03-15 NOTE — Telephone Encounter (Signed)
Patient's mother called the office to speak with PCP regarding daughter's medication. As per patient's mother she had requested [facility] on [address]. Please resend medication to [facility].   Thank you.

## 2016-03-15 NOTE — Telephone Encounter (Signed)
Called and left pt's mother a message to inform her that [facility]

## 2016-03-17 ENCOUNTER — Ambulatory Visit: Payer: Medicaid Other

## 2016-03-24 ENCOUNTER — Other Ambulatory Visit: Payer: Self-pay | Admitting: Family Medicine

## 2016-03-24 ENCOUNTER — Telehealth: Payer: Self-pay | Admitting: Family Medicine

## 2016-03-24 ENCOUNTER — Ambulatory Visit: Payer: Medicaid Other | Attending: Family Medicine | Admitting: *Deleted

## 2016-03-24 ENCOUNTER — Ambulatory Visit: Payer: Medicaid Other

## 2016-03-24 VITALS — BP 150/88 | HR 108 | Resp 20

## 2016-03-24 DIAGNOSIS — R011 Cardiac murmur, unspecified: Secondary | ICD-10-CM | POA: Insufficient documentation

## 2016-03-24 DIAGNOSIS — E1065 Type 1 diabetes mellitus with hyperglycemia: Secondary | ICD-10-CM | POA: Diagnosis present

## 2016-03-24 DIAGNOSIS — IMO0001 Reserved for inherently not codable concepts without codable children: Secondary | ICD-10-CM

## 2016-03-24 DIAGNOSIS — Z7689 Persons encountering health services in other specified circumstances: Secondary | ICD-10-CM

## 2016-03-24 LAB — GLUCOSE, POCT (MANUAL RESULT ENTRY): POC GLUCOSE: 88 mg/dL (ref 70–99)

## 2016-03-24 LAB — LIPID PANEL
Cholesterol: 149 mg/dL (ref <200)
HDL: 60 mg/dL (ref >50)
LDL Cholesterol: 80 mg/dL (ref <100)
Total CHOL/HDL Ratio: 2.5 Ratio (ref <5.0)
Triglycerides: 46 mg/dL (ref <150)
VLDL: 9 mg/dL (ref <30)

## 2016-03-24 MED ORDER — LISINOPRIL 10 MG PO TABS: 10.0000 mg | ORAL_TABLET | Freq: Every day | ORAL | 2 refills | Status: DC

## 2016-03-24 NOTE — Progress Notes (Signed)
Pt arrived for nurse visit to f/u CBG and blood pressure. She is accompanied by her mother. Lab results reviewed by mother's request. Pt also gave specimen for urinalysis and Lipid panel as ordered from last OV: 03/15/16. Mother stated will purchase a BP machine to check BP at home. BP slightly elevated in office. Pt admits that she takes her medication daily.She stated she has a headache after taking Amlodipine.  Denies blurred vision, edema, or chest pain.

## 2016-03-24 NOTE — Progress Notes (Signed)
Patient here for lab visit only 

## 2016-03-28 NOTE — Telephone Encounter (Signed)
Patient mother verify DOB  CMA spoke with mother was aware and understood that her cholesterol level were normal and to recheck in 1 year.

## 2016-03-28 NOTE — Telephone Encounter (Signed)
-----   Message from Lizbeth BarkMandesia R Hairston, FNP sent at 03/28/2016  5:02 AM EST ----- -Cholesterol levels were normal. -Will recheck again in 1 year.

## 2016-03-28 NOTE — Telephone Encounter (Signed)
CMA called Patient to go over with lab results No answer left a VM stating the reason of the call & to call us back

## 2016-04-09 ENCOUNTER — Encounter (HOSPITAL_COMMUNITY): Payer: Self-pay | Admitting: *Deleted

## 2016-04-09 ENCOUNTER — Ambulatory Visit (HOSPITAL_COMMUNITY)
Admission: EM | Admit: 2016-04-09 | Discharge: 2016-04-09 | Disposition: A | Discharge status: Home / Self Care | Payer: Medicaid Other | Attending: Emergency Medicine | Admitting: Emergency Medicine

## 2016-04-09 DIAGNOSIS — K047 Periapical abscess without sinus: Secondary | ICD-10-CM

## 2016-04-09 HISTORY — DX: Essential (primary) hypertension: I10

## 2016-04-09 MED ORDER — HYDROCODONE-ACETAMINOPHEN 5-325 MG PO TABS: 1.0000 | ORAL_TABLET | ORAL | 0 refills | Status: DC | PRN

## 2016-04-09 MED ORDER — PENICILLIN V POTASSIUM 500 MG PO TABS: 500.0000 mg | ORAL_TABLET | Freq: Four times a day (QID) | ORAL | 0 refills | Status: AC

## 2016-04-09 MED ORDER — IBUPROFEN 600 MG PO TABS: 600.0000 mg | ORAL_TABLET | Freq: Four times a day (QID) | ORAL | 0 refills | Status: DC | PRN

## 2016-04-09 NOTE — ED Triage Notes (Signed)
C/O left lower toothache x 3 days.  Now noticing bumpt to area.  Denies any fevers.  Has tried Advil and [employer].

## 2016-04-09 NOTE — Discharge Instructions (Signed)
You have a dental abscess. Take penicillin 4 times a day for 10 days. Use ibuprofen every 6 hours for pain. Use the hydrocodone every 4-6 hours as needed for severe pain. Follow-up with the dentist as soon as possible.

## 2016-04-09 NOTE — ED Provider Notes (Signed)
[facility]    CSN: [id number] Arrival date & time: 04/09/16  1751     History   Chief Complaint Chief Complaint  Patient presents with  . Dental Pain    HPI Morgan Moon is a 21 y.o. female.   HPI  She is a 21 year old woman here for evaluation of dental pain. This involves the left lower molar. It has been gradually worsening over the last 3 days. There is a bump that she has expressed pus from. No known fevers. No difficulty swallowing. Mom is planning on calling a dentist tomorrow.  Past Medical History:  Diagnosis Date  . Diabetes mellitus without complication ([place])   . Hypertension     Patient Active Problem List   Diagnosis Date Noted  . DM (diabetes mellitus), type 1, uncontrolled ([place]) 06/30/2015  . Vitiligo 06/30/2015  . Systolic murmur 37/62/8315    History reviewed. No pertinent surgical history.  OB History    No data available       Home Medications    Prior to Admission medications   Medication Sig Start Date End Date Taking? Authorizing Provider  ACCU-CHEK AVIVA PLUS test strip USE AS DIRECTED. 03/15/16  Yes Alfonse Spruce, FNP  ACCU-CHEK SOFTCLIX LANCETS lancets USE AS INSTRUCTED 03/15/16  Yes Alfonse Spruce, FNP  Blood Glucose Monitoring Suppl (ACCU-CHEK AVIVA PLUS) w/Device KIT 1 each by Does not apply route 3 (three) times daily. 06/30/15  Yes Lisabeth Pick, MD  Insulin Isophane & Regular Human (HUMULIN 70/30 KWIKPEN) (70-30) 100 UNIT/ML PEN Inject 10 Units into the skin 2 (two) times daily. 03/15/16  Yes Alfonse Spruce, FNP  Insulin Pen Needle 31G X 5 MM MISC Use as directed for twice daily insulin dosing 03/15/16  Yes Mandesia R Hairston, FNP  IRON PO Take by mouth.   Yes Historical Provider, MD  lisinopril (PRINIVIL,ZESTRIL) 10 MG tablet Take 1 tablet (10 mg total) by mouth daily. 03/24/16  Yes Alfonse Spruce, FNP  ACCU-CHEK SOFTCLIX LANCETS lancets USE AS DIRECTED. 03/15/16   Alfonse Spruce, FNP    HYDROcodone-acetaminophen (NORCO) 5-325 MG tablet Take 1 tablet by mouth every 4 (four) hours as needed for moderate pain. 04/09/16   Melony Overly, MD  ibuprofen (ADVIL,MOTRIN) 600 MG tablet Take 1 tablet (600 mg total) by mouth every 6 (six) hours as needed for moderate pain. 04/09/16   Melony Overly, MD  penicillin v potassium (VEETID) 500 MG tablet Take 1 tablet (500 mg total) by mouth 4 (four) times daily. 04/09/16 04/19/16  Melony Overly, MD    Family History Family History  Problem Relation Age of Onset  . Hypertension Maternal Aunt   . Hypertension Maternal Uncle   . Hypertension Maternal Grandmother   . Hypertension Maternal Grandfather     Social History Social History  Substance Use Topics  . Smoking status: Never Smoker  . Smokeless tobacco: Never Used  . Alcohol use No     Allergies   Amlodipine   Review of Systems Review of Systems As in history of present illness  Physical Exam Triage Vital Signs ED Triage Vitals  Enc Vitals Group     BP 04/09/16 1918 158/83     Pulse Rate 04/09/16 1918 104     Resp 04/09/16 1918 18     Temp 04/09/16 1918 99.6 F (37.6 C)     Temp Source 04/09/16 1918 Oral     SpO2 04/09/16 1918 100 %  Weight --      Height --      Head Circumference --      Peak Flow --      Pain Score 04/09/16 1920 5     Pain Loc --      Pain Edu? --      Excl. in [place]? --    No data found.   Updated Vital Signs BP 158/83   Pulse 104   Temp 99.6 F (37.6 C) (Oral)   Resp 18   LMP 04/02/2016 (Approximate)   SpO2 100%   Visual Acuity Right Eye Distance:   Left Eye Distance:   Bilateral Distance:    Right Eye Near:   Left Eye Near:    Bilateral Near:     Physical Exam  Constitutional: She is oriented to person, place, and time. She appears well-developed and well-nourished. No distress.  HENT:  Mouth/Throat:    Cardiovascular: Normal rate.   Pulmonary/Chest: Effort normal.  Neurological: She is alert and oriented to person,  place, and time.     UC Treatments / Results  Labs (all labs ordered are listed, but only abnormal results are displayed) Labs Reviewed - No data to display  EKG  EKG Interpretation None       Radiology No results found.  Procedures Procedures (including critical care time)  Medications Ordered in UC Medications - No data to display   Initial Impression / Assessment and Plan / UC Course  I have reviewed the triage vital signs and the nursing notes.  Pertinent labs & imaging results that were available during my care of the patient were reviewed by me and considered in my medical decision making (see chart for details).     Treatment with penicillin and ibuprofen. Hydrocodone as needed for pain. Follow-up with dentist as soon as possible.  Final Clinical Impressions(s) / UC Diagnoses   Final diagnoses:  Dental abscess    New Prescriptions New Prescriptions   HYDROCODONE-ACETAMINOPHEN (NORCO) 5-325 MG TABLET    Take 1 tablet by mouth every 4 (four) hours as needed for moderate pain.   IBUPROFEN (ADVIL,MOTRIN) 600 MG TABLET    Take 1 tablet (600 mg total) by mouth every 6 (six) hours as needed for moderate pain.   PENICILLIN V POTASSIUM (VEETID) 500 MG TABLET    Take 1 tablet (500 mg total) by mouth 4 (four) times daily.     Melony Overly, MD 04/09/16 [phone]

## 2016-04-10 ENCOUNTER — Encounter: Payer: Self-pay | Admitting: Family Medicine

## 2016-04-10 DIAGNOSIS — H10413 Chronic giant papillary conjunctivitis, bilateral: Secondary | ICD-10-CM | POA: Diagnosis not present

## 2016-04-10 DIAGNOSIS — E109 Type 1 diabetes mellitus without complications: Secondary | ICD-10-CM | POA: Diagnosis not present

## 2016-04-10 LAB — HM DIABETES EYE EXAM

## 2016-05-17 ENCOUNTER — Other Ambulatory Visit: Payer: Self-pay | Admitting: Family Medicine

## 2016-11-28 ENCOUNTER — Other Ambulatory Visit: Payer: Self-pay | Admitting: Family Medicine

## 2016-11-28 MED ORDER — INSULIN NPH ISOPHANE & REGULAR (70-30) 100 UNIT/ML ~~LOC~~ SUSP: 10.0000 [IU] | Freq: Two times a day (BID) | SUBCUTANEOUS | 0 refills | Status: DC

## 2016-11-28 MED ORDER — "INSULIN SYRINGE-NEEDLE U-100 31G X 15/64"" 0.5 ML MISC": 0 refills | Status: DC

## 2016-11-28 NOTE — Telephone Encounter (Signed)
[facility] now has [facility] and Medicaid does not cover Humulin 70/30 pen and it only covers the vial. Reordered as the vial x 1 vial as patient must have office visit for any refills. Cannot switch to any pens covered as that would change the dose she is receiving and that is unsafe without a visit with PCP>

## 2016-11-29 ENCOUNTER — Other Ambulatory Visit: Payer: Self-pay | Admitting: Family Medicine

## 2016-11-29 ENCOUNTER — Telehealth: Payer: Self-pay

## 2016-11-29 NOTE — Telephone Encounter (Signed)
CMA call regarding medication refill is ready for pick p at pharmacy   Patient mother was aware and understood

## 2016-11-29 NOTE — Telephone Encounter (Signed)
Pt. Mother called requesting to speak with pt. Nurse regarding her medication. Please f/u with pt.

## 2016-11-29 NOTE — Telephone Encounter (Signed)
CMA call regarding medication advice   Patient mother did not answer but left a VM stating the reason of the call & to call back

## 2016-11-30 NOTE — Telephone Encounter (Signed)
CMA call 2nd time regarding medication changes   Patient mother did not answer but left a VM stating the reason of the call

## 2016-12-04 ENCOUNTER — Ambulatory Visit: Payer: Medicaid Other | Attending: Family Medicine | Admitting: Family Medicine

## 2016-12-04 ENCOUNTER — Encounter: Payer: Self-pay | Admitting: Family Medicine

## 2016-12-04 VITALS — BP 136/96 | HR 125 | Temp 98.2°F | Resp 18 | Ht 66.0 in | Wt 130.8 lb

## 2016-12-04 DIAGNOSIS — Z79891 Long term (current) use of opiate analgesic: Secondary | ICD-10-CM | POA: Diagnosis not present

## 2016-12-04 DIAGNOSIS — E1065 Type 1 diabetes mellitus with hyperglycemia: Secondary | ICD-10-CM

## 2016-12-04 DIAGNOSIS — I10 Essential (primary) hypertension: Secondary | ICD-10-CM | POA: Insufficient documentation

## 2016-12-04 DIAGNOSIS — E119 Type 2 diabetes mellitus without complications: Secondary | ICD-10-CM | POA: Diagnosis not present

## 2016-12-04 DIAGNOSIS — Z79899 Other long term (current) drug therapy: Secondary | ICD-10-CM | POA: Insufficient documentation

## 2016-12-04 DIAGNOSIS — E109 Type 1 diabetes mellitus without complications: Secondary | ICD-10-CM

## 2016-12-04 DIAGNOSIS — Z794 Long term (current) use of insulin: Secondary | ICD-10-CM | POA: Insufficient documentation

## 2016-12-04 LAB — GLUCOSE, POCT (MANUAL RESULT ENTRY): POC GLUCOSE: 72 mg/dL (ref 70–99)

## 2016-12-04 LAB — POCT UA - MICROALBUMIN
Creatinine, POC: 100 mg/dL
Microalbumin Ur, POC: 80 mg/L

## 2016-12-04 LAB — POCT GLYCOSYLATED HEMOGLOBIN (HGB A1C): HEMOGLOBIN A1C: 7.6

## 2016-12-04 MED ORDER — "INSULIN SYRINGE-NEEDLE U-100 31G X 15/64"" 0.5 ML MISC": 3 refills | Status: AC

## 2016-12-04 MED ORDER — LISINOPRIL 20 MG PO TABS: ORAL_TABLET | ORAL | 0 refills | Status: DC

## 2016-12-04 MED ORDER — GLUCOSE BLOOD VI STRP: ORAL_STRIP | 3 refills | Status: DC

## 2016-12-04 MED ORDER — INSULIN ASPART PROT & ASPART (70-30 MIX) 100 UNIT/ML PEN: PEN_INJECTOR | SUBCUTANEOUS | 11 refills | Status: DC

## 2016-12-04 MED ORDER — ACCU-CHEK SOFTCLIX LANCETS MISC: 3 refills | Status: DC

## 2016-12-04 MED ORDER — "INSULIN SYRINGE-NEEDLE U-100 31G X 15/64"" 0.5 ML MISC": 3 refills | Status: DC

## 2016-12-04 NOTE — Patient Instructions (Addendum)
Diabetes Mellitus and Food It is important for you to manage your blood sugar (glucose) level. Your blood glucose level can be greatly affected by what you eat. Eating healthier foods in the appropriate amounts throughout the day at about the same time each day will help you control your blood glucose level. It can also help slow or prevent worsening of your diabetes mellitus. Healthy eating may even help you improve the level of your blood pressure and reach or maintain a healthy weight. General recommendations for healthful eating and cooking habits include:  Eating meals and snacks regularly. Avoid going long periods of time without eating to lose weight.  Eating a diet that consists mainly of plant-based foods, such as fruits, vegetables, nuts, legumes, and whole grains.  Using low-heat cooking methods, such as baking, instead of high-heat cooking methods, such as deep frying.  Work with your dietitian to make sure you understand how to use the Nutrition Facts information on food labels. How can food affect me? Carbohydrates Carbohydrates affect your blood glucose level more than any other type of food. Your dietitian will help you determine how many carbohydrates to eat at each meal and teach you how to count carbohydrates. Counting carbohydrates is important to keep your blood glucose at a healthy level, especially if you are using insulin or taking certain medicines for diabetes mellitus. Alcohol Alcohol can cause sudden decreases in blood glucose (hypoglycemia), especially if you use insulin or take certain medicines for diabetes mellitus. Hypoglycemia can be a life-threatening condition. Symptoms of hypoglycemia (sleepiness, dizziness, and disorientation) are similar to symptoms of having too much alcohol. If your health care provider has given you approval to drink alcohol, do so in moderation and use the following guidelines:  Women should not have more than one drink per day, and men  should not have more than two drinks per day. One drink is equal to: ? 12 oz of beer. ? 5 oz of wine. ? 1 oz of hard liquor.  Do not drink on an empty stomach.  Keep yourself hydrated. Have water, diet soda, or unsweetened iced tea.  Regular soda, juice, and other mixers might contain a lot of carbohydrates and should be counted.  What foods are not recommended? As you make food choices, it is important to remember that all foods are not the same. Some foods have fewer nutrients per serving than other foods, even though they might have the same number of calories or carbohydrates. It is difficult to get your body what it needs when you eat foods with fewer nutrients. Examples of foods that you should avoid that are high in calories and carbohydrates but low in nutrients include:  Trans fats (most processed foods list trans fats on the Nutrition Facts label).  Regular soda.  Juice.  Candy.  Sweets, such as cake, pie, doughnuts, and cookies.  Fried foods.  What foods can I eat? Eat nutrient-rich foods, which will nourish your body and keep you healthy. The food you should eat also will depend on several factors, including:  The calories you need.  The medicines you take.  Your weight.  Your blood glucose level.  Your blood pressure level.  Your cholesterol level.  You should eat a variety of foods, including:  Protein. ? Lean cuts of meat. ? Proteins low in saturated fats, such as fish, egg whites, and beans. Avoid processed meats.  Fruits and vegetables. ? Fruits and vegetables that may help control blood glucose levels, such as apples,   mangoes, and yams.  Dairy products. ? Choose fat-free or low-fat dairy products, such as milk, yogurt, and cheese.  Grains, bread, pasta, and rice. ? Choose whole grain products, such as multigrain bread, whole oats, and brown rice. These foods may help control blood pressure.  Fats. ? Foods containing healthful fats, such as  nuts, avocado, olive oil, canola oil, and fish.  Does everyone with diabetes mellitus have the same meal plan? Because every person with diabetes mellitus is different, there is not one meal plan that works for everyone. It is very important that you meet with a dietitian who will help you create a meal plan that is just right for you. This information is not intended to replace advice given to you by your health care provider. Make sure you discuss any questions you have with your health care provider. Document Released: 12/01/2004 Document Revised: 08/12/2015 Document Reviewed: 01/31/2013 Elsevier Interactive Patient Education  2017 [employer] Diabetes and Foot Care Diabetes may cause you to have problems because of poor blood supply (circulation) to your feet and legs. This may cause the skin on your feet to become thinner, break easier, and heal more slowly. Your skin may become dry, and the skin may peel and crack. You may also have nerve damage in your legs and feet causing decreased feeling in them. You may not notice minor injuries to your feet that could lead to infections or more serious problems. Taking care of your feet is one of the most important things you can do for yourself. Follow these instructions at home:  Wear shoes at all times, even in the house. Do not go barefoot. Bare feet are easily injured.  Check your feet daily for blisters, cuts, and redness. If you cannot see the bottom of your feet, use a mirror or ask someone for help.  Wash your feet with warm water (do not use hot water) and mild soap. Then pat your feet and the areas between your toes until they are completely dry. Do not soak your feet as this can dry your skin.  Apply a moisturizing lotion or petroleum jelly (that does not contain alcohol and is unscented) to the skin on your feet and to dry, brittle toenails. Do not apply lotion between your toes.  Trim your toenails straight across. Do not dig under  them or around the cuticle. File the edges of your nails with an emery board or nail file.  Do not cut corns or calluses or try to remove them with medicine.  Wear clean socks or stockings every day. Make sure they are not too tight. Do not wear knee-high stockings since they may decrease blood flow to your legs.  Wear shoes that fit properly and have enough cushioning. To break in new shoes, wear them for just a few hours a day. This prevents you from injuring your feet. Always look in your shoes before you put them on to be sure there are no objects inside.  Do not cross your legs. This may decrease the blood flow to your feet.  If you find a minor scrape, cut, or break in the skin on your feet, keep it and the skin around it clean and dry. These areas may be cleansed with mild soap and water. Do not cleanse the area with peroxide, alcohol, or iodine.  When you remove an adhesive bandage, be sure not to damage the skin around it.  If you have a wound, look at it several times   a day to make sure it is healing.  Do not use heating pads or hot water bottles. They may burn your skin. If you have lost feeling in your feet or legs, you may not know it is happening until it is too late.  Make sure your health care provider performs a complete foot exam at least annually or more often if you have foot problems. Report any cuts, sores, or bruises to your health care provider immediately. Contact a health care provider if:  You have an injury that is not healing.  You have cuts or breaks in the skin.  You have an ingrown nail.  You notice redness on your legs or feet.  You feel burning or tingling in your legs or feet.  You have pain or cramps in your legs and feet.  Your legs or feet are numb.  Your feet always feel cold. Get help right away if:  There is increasing redness, swelling, or pain in or around a wound.  There is a red line that goes up your leg.  Pus is coming from a  wound.  You develop a fever or as directed by your health care provider.  You notice a bad smell coming from an ulcer or wound. This information is not intended to replace advice given to you by your health care provider. Make sure you discuss any questions you have with your health care provider. Document Released: 03/03/2000 Document Revised: 08/12/2015 Document Reviewed: 08/13/2012 Elsevier Interactive Patient Education  2017 [employer]  

## 2016-12-04 NOTE — Progress Notes (Signed)
Subjective:  Patient ID: Morgan Moon, female    DOB: 1995-07-07  Age: 21 y.o. MRN: [id number]  CC: Hypertension and Diabetes   HPI Morgan Moon presents for follow up for HTN and DM. Patient denies foot ulcerations, nausea, paresthesia of the feet, polydipsia, polyuria, visual disturbances and vomitting.  Evaluation to date has been included: fasting blood sugar, fasting lipid panel and hemoglobin A1C. She brings glucometer to office. Home sugars: BGs are running  consistent with Hgb A1C. CBG's range 70's to 256. Most common readings in the 90-110's.  Treatment to date: insulin. History of hypertension. . Patient mother reports patient  is starting to incorporate exercise and is adherent to low salt diet. She does not check at home. Cardiac symptoms none. Patient denies chest pain, chest pressure/discomfort, fatigue, near-syncope, palpitations and syncope.  Cardiovascular risk factors: diabetes mellitus and hypertension. Use of agents associated with hypertension: none. History of target organ damage: none.   Outpatient Medications Prior to Visit  Medication Sig Dispense Refill  . B-D UF III MINI PEN NEEDLES 31G X 5 MM MISC USE AS DIRECTED FOR TWICE DAILY INSULIN DOSING. 100 each 5  . Blood Glucose Monitoring Suppl (ACCU-CHEK AVIVA PLUS) w/Device KIT 1 each by Does not apply route 3 (three) times daily. 1 kit 0  . HYDROcodone-acetaminophen (NORCO) 5-325 MG tablet Take 1 tablet by mouth every 4 (four) hours as needed for moderate pain. 15 tablet 0  . ibuprofen (ADVIL,MOTRIN) 600 MG tablet Take 1 tablet (600 mg total) by mouth every 6 (six) hours as needed for moderate pain. 30 tablet 0  . IRON PO Take by mouth.    Marland Kitchen ACCU-CHEK AVIVA PLUS test strip USE AS DIRECTED. 300 each 3  . ACCU-CHEK SOFTCLIX LANCETS lancets USE AS INSTRUCTED 100 each 11  . ACCU-CHEK SOFTCLIX LANCETS lancets USE AS DIRECTED. 300 each 3  . insulin NPH-regular Human (HUMULIN 70/30) (70-30) 100 UNIT/ML injection Inject 10  Units into the skin 2 (two) times daily with a meal. 10 mL 0  . Insulin Syringe-Needle U-100 (BD INSULIN SYRINGE ULTRAFINE) 31G X 15/64" 0.5 ML MISC Use as directed twice daily. 100 each 0  . lisinopril (PRINIVIL,ZESTRIL) 10 MG tablet TAKE 1 TABLET(10 MG) BY MOUTH DAILY 30 tablet 0   No facility-administered medications prior to visit.     ROS Review of Systems  Constitutional: Negative.   Eyes: Negative.   Respiratory: Negative.   Cardiovascular: Negative.   Gastrointestinal: Negative.   Endocrine: Negative.   Skin: Negative.   Neurological: Negative.    Objective:  BP (!) 136/96 (BP Location: Left Arm, Patient Position: Sitting, Cuff Size: Normal)   Pulse (!) 125   Temp 98.2 F (36.8 C) (Oral)   Resp 18   Ht '5\' 6"'$  (1.676 m)   Wt 130 lb 12.8 oz (59.3 kg)   SpO2 100%   BMI 21.11 kg/m   BP/Weight 12/04/2016 1/95/0932 08/25/1243  Systolic BP 809 983 382  Diastolic BP 96 83 88  Wt. (Lbs) 130.8 - -  BMI 21.11 - -   Physical Exam  Constitutional: She appears well-developed and well-nourished.  Eyes: Pupils are equal, round, and reactive to light. Conjunctivae are normal.  Neck: No JVD present.  Cardiovascular: Normal rate, regular rhythm, normal heart sounds and intact distal pulses.   Pulmonary/Chest: Effort normal and breath sounds normal.  Abdominal: Soft. Bowel sounds are normal. There is no tenderness.  Skin: Skin is warm and dry.  Nursing note and vitals reviewed.  Assessment & Plan:   1. Type 1 diabetes mellitus without complication (HCC)  - Glucose (CBG) - POCT glycosylated hemoglobin (Hb A1C) - POCT UA - Microalbumin - lisinopril (PRINIVIL,ZESTRIL) 20 MG tablet; TAKE 1 TABLET(10 MG) BY MOUTH DAILY  Dispense: 90 tablet; Refill: 0 - Insulin Syringe-Needle U-100 (BD INSULIN SYRINGE ULTRAFINE) 31G X 15/64" 0.5 ML MISC; Use as directed twice daily.  Dispense: 300 each; Refill: 3 - insulin aspart protamine - aspart (NOVOLOG 70/30 MIX) (70-30) 100 UNIT/ML FlexPen;  Inject 12 Units into the skin in the morning with meal. Inject 10 Units into the skin in the evening with meal.  Dispense: 5 pen; Refill: 11 - glucose blood (ACCU-CHEK AVIVA PLUS) test strip; USE AS DIRECTED.  Dispense: 300 each; Refill: 3 - ACCU-CHEK SOFTCLIX LANCETS lancets; USE AS INSTRUCTED  Dispense: 300 each; Refill: 3   2. Essential hypertension Lisinopril increased for better BP control. Follow up in 2 weeks with clinical pharmacist for BP check. - lisinopril (PRINIVIL,ZESTRIL) 20 MG tablet; TAKE 1 TABLET(10 MG) BY MOUTH DAILY  Dispense: 90 tablet; Refill: 0    Follow-up: Return in about 2 weeks (around 12/18/2016) for DM/HTN check with Stacy.   Alfonse Spruce FNP

## 2016-12-19 ENCOUNTER — Ambulatory Visit: Payer: Medicaid Other | Admitting: Pharmacist

## 2016-12-26 ENCOUNTER — Encounter: Payer: Self-pay | Admitting: Pharmacist

## 2016-12-26 ENCOUNTER — Ambulatory Visit: Payer: Medicaid Other | Attending: Family Medicine | Admitting: Pharmacist

## 2016-12-26 DIAGNOSIS — Z794 Long term (current) use of insulin: Secondary | ICD-10-CM | POA: Insufficient documentation

## 2016-12-26 DIAGNOSIS — E109 Type 1 diabetes mellitus without complications: Secondary | ICD-10-CM | POA: Diagnosis not present

## 2016-12-26 DIAGNOSIS — Z79899 Other long term (current) drug therapy: Secondary | ICD-10-CM | POA: Diagnosis not present

## 2016-12-26 DIAGNOSIS — E1165 Type 2 diabetes mellitus with hyperglycemia: Secondary | ICD-10-CM | POA: Diagnosis present

## 2016-12-26 DIAGNOSIS — I10 Essential (primary) hypertension: Secondary | ICD-10-CM | POA: Insufficient documentation

## 2016-12-26 MED ORDER — INSULIN ASPART PROT & ASPART (70-30 MIX) 100 UNIT/ML PEN: PEN_INJECTOR | SUBCUTANEOUS | 2 refills | Status: DC

## 2016-12-26 NOTE — Progress Notes (Signed)
    S:     Chief Complaint  Patient presents with  . Medication Management    Patient arrives in good spirits.  Presents for diabetes evaluation, education, and management at the request of Arrie Senate, FNP. Patient was referred on 12/04/16.  Patient was last seen by Primary Care Provider on 12/04/16.   Patient reports Type 1 Diabetes was diagnosed 06/27/15.   Patient reports adherence with medications.  Current diabetes medications include: Novolog 70/30 10 units in the morning and 10 units in the evening. She never increased to 12 in the morning as directed  Current hypertension medications: lisinopril 10 mg daily was prescribed but patient still taking 2.5 mg daily to not waste it.   Patient denies hypoglycemic events.  Patient reported dietary habits: trying to eat healthy and avoid high salt foods  Patient reported exercise habits: trying to be more active   Patient denies nocturia.  Patient denies neuropathy. Patient denies visual changes. Patient reports self foot exams.   O:  Physical Exam   ROS   Lab Results  Component Value Date   HGBA1C 7.6 12/04/2016   Vitals:   12/26/16 1534  BP: 136/88  Pulse: (!) 110    Home fasting CBG: 90s-110s, one 146  2 hour post-prandial/random CBG: <180, one 203  A/P: Diabetes longstanding currently uncontrolled based on A1c of 7.6. Patient denies hypoglycemic events and is able to verbalize appropriate hypoglycemia management plan. Patient reports adherence with medication. Control is suboptimal due to dietary indiscretion and lack of regular follow up with PCP over the past year.  Continue current insulin dose that she has been taking: 10 units BID. Her numbers have been excellent so I don't think that she needs to take the 12 units in the morning at this time.   Hypertension longstanding currently uncontrolled on lisinopril 2.5 mg daily. Tries to adhere to low salt diet. Patient was prescribed 10 mg at last visit, she  will start 10 mg today. Will recheck blood pressure at next visit.   Next A1C anticipated December 2018.    Written patient instructions provided.  Total time in face to face counseling 20 minutes.   Follow up in Pharmacist Clinic Visit in 6 weeks.

## 2016-12-26 NOTE — Patient Instructions (Signed)
Thanks for coming to see me!  Continue taking 10 units of insulin in the morning and 10 units in the evening  Come back in 6 weeks to see me for blood sugar check

## 2017-02-06 ENCOUNTER — Ambulatory Visit: Payer: Medicaid Other | Admitting: Pharmacist

## 2017-02-13 ENCOUNTER — Ambulatory Visit: Payer: Medicaid Other | Admitting: Pharmacist

## 2017-02-13 NOTE — Progress Notes (Deleted)
    S:     No chief complaint on file.   Patient arrives in good spirits.  Presents for diabetes evaluation, education, and management at the request of Arrie SenateMandesia Hairston, FNP. Patient was referred on 12/04/16.  Patient was last seen by Primary Care Provider on 12/04/16.   Patient reports Type 1 Diabetes was diagnosed 06/27/15.   Patient reports adherence with medications.  Current diabetes medications include: Novolog 70/30 10 units in the morning and 10 units in the evening. She never increased to 12 in the morning as directed  Current hypertension medications: lisinopril 10 mg daily was prescribed but patient still taking 2.5 mg daily to not waste it.   Patient denies hypoglycemic events.  Patient reported dietary habits: trying to eat healthy and avoid high salt foods  Patient reported exercise habits: trying to be more active   Patient denies nocturia.  Patient denies neuropathy. Patient denies visual changes. Patient reports self foot exams.   O:  Physical Exam   ROS   Lab Results  Component Value Date   HGBA1C 7.6 12/04/2016   There were no vitals filed for this visit.  Home fasting CBG: 90s-110s, one 146  2 hour post-prandial/random CBG: <180, one 203  A/P: Diabetes longstanding currently uncontrolled based on A1c of 7.6. Patient denies hypoglycemic events and is able to verbalize appropriate hypoglycemia management plan. Patient reports adherence with medication. Control is suboptimal due to dietary indiscretion and lack of regular follow up with PCP over the past year.  Continue current insulin dose that she has been taking: 10 units BID. Her numbers have been excellent so I don't think that she needs to take the 12 units in the morning at this time.   Hypertension longstanding currently uncontrolled on lisinopril 2.5 mg daily. Tries to adhere to low salt diet. Patient was prescribed 10 mg at last visit, she will start 10 mg today. Will recheck blood pressure at  next visit.   Next A1C anticipated December 2018.    Written patient instructions provided.  Total time in face to face counseling 20 minutes.   Follow up with PCP in 1 month.

## 2017-06-20 ENCOUNTER — Ambulatory Visit: Payer: Medicaid Other | Admitting: Nurse Practitioner

## 2017-07-03 ENCOUNTER — Encounter: Payer: Self-pay | Admitting: Family Medicine

## 2017-07-03 LAB — HM DIABETES EYE EXAM

## 2017-07-23 ENCOUNTER — Encounter: Payer: Self-pay | Admitting: Family Medicine

## 2017-07-23 ENCOUNTER — Ambulatory Visit: Payer: Medicaid Other | Attending: Family Medicine | Admitting: Family Medicine

## 2017-07-23 VITALS — BP 166/88 | HR 76 | Temp 98.5°F | Ht 66.0 in | Wt 128.0 lb

## 2017-07-23 DIAGNOSIS — Z79899 Other long term (current) drug therapy: Secondary | ICD-10-CM | POA: Diagnosis not present

## 2017-07-23 DIAGNOSIS — I10 Essential (primary) hypertension: Secondary | ICD-10-CM | POA: Diagnosis not present

## 2017-07-23 DIAGNOSIS — D649 Anemia, unspecified: Secondary | ICD-10-CM | POA: Insufficient documentation

## 2017-07-23 DIAGNOSIS — E10649 Type 1 diabetes mellitus with hypoglycemia without coma: Secondary | ICD-10-CM | POA: Diagnosis not present

## 2017-07-23 DIAGNOSIS — L80 Vitiligo: Secondary | ICD-10-CM | POA: Diagnosis not present

## 2017-07-23 DIAGNOSIS — Z803 Family history of malignant neoplasm of breast: Secondary | ICD-10-CM | POA: Insufficient documentation

## 2017-07-23 DIAGNOSIS — E109 Type 1 diabetes mellitus without complications: Secondary | ICD-10-CM | POA: Diagnosis not present

## 2017-07-23 DIAGNOSIS — Z794 Long term (current) use of insulin: Secondary | ICD-10-CM | POA: Diagnosis not present

## 2017-07-23 LAB — GLUCOSE, POCT (MANUAL RESULT ENTRY): POC GLUCOSE: 293 mg/dL — AB (ref 70–99)

## 2017-07-23 LAB — POCT GLYCOSYLATED HEMOGLOBIN (HGB A1C): Hemoglobin A1C: 9.1

## 2017-07-23 MED ORDER — INSULIN ASPART PROT & ASPART (70-30 MIX) 100 UNIT/ML PEN: 15.0000 [IU] | PEN_INJECTOR | Freq: Two times a day (BID) | SUBCUTANEOUS | 3 refills | Status: DC

## 2017-07-23 MED ORDER — LISINOPRIL 20 MG PO TABS: ORAL_TABLET | ORAL | 1 refills | Status: DC

## 2017-07-23 NOTE — Progress Notes (Signed)
Subjective:  Patient ID: Morgan Moon, female    DOB: December 25, 1995  Age: 22 y.o. MRN: [id number]  CC: Diabetes   HPI Morgan Moon is a 22 year old female with a history of type 1 diabetes mellitus (A1c 9.1), hypertension, vitiligo, and anemia who presents today to establish care with me accompanied by her mother. Her A1c is 9.1 which has trended up from 7.6 previously and she endorses compliance with 10 units of NovoLog 70/30 and has been compliant with a diabetic diet but does not exercise.  She denies visual concerns, numbness in extremities. Her blood pressure is elevated today and she is yet to take her antihypertensive because she has not had a regular meal she says. She takes over-the-counter iron pills and is wondering if she can receive prescription iron tablets.  Last hemoglobin from 06/2015 was 9.6.  Her mother was recently diagnosed with breast cancer and is requesting a breast exam be performed on her daughter today which the patient is in agreement with. Past Medical History:  Diagnosis Date  . Diabetes mellitus without complication ([place])   . Hypertension     History reviewed. No pertinent surgical history.  Allergies  Allergen Reactions  . Amlodipine Other (See Comments)    headaches     Outpatient Medications Prior to Visit  Medication Sig Dispense Refill  . ACCU-CHEK SOFTCLIX LANCETS lancets USE AS INSTRUCTED 300 each 3  . B-D UF III MINI PEN NEEDLES 31G X 5 MM MISC USE AS DIRECTED FOR TWICE DAILY INSULIN DOSING. 100 each 5  . Blood Glucose Monitoring Suppl (ACCU-CHEK AVIVA PLUS) w/Device KIT 1 each by Does not apply route 3 (three) times daily. 1 kit 0  . glucose blood (ACCU-CHEK AVIVA PLUS) test strip USE AS DIRECTED. 300 each 3  . ibuprofen (ADVIL,MOTRIN) 600 MG tablet Take 1 tablet (600 mg total) by mouth every 6 (six) hours as needed for moderate pain. 30 tablet 0  . Insulin Syringe-Needle U-100 (BD INSULIN SYRINGE ULTRAFINE) 31G X 15/64" 0.5 ML MISC Use  as directed twice daily. 300 each 3  . IRON PO Take by mouth.    . insulin aspart protamine - aspart (NOVOLOG 70/30 MIX) (70-30) 100 UNIT/ML FlexPen Inject 10 Units into the skin in the morning with meal. Inject 10 Units into the skin in the evening with meal. 5 pen 2  . lisinopril (PRINIVIL,ZESTRIL) 20 MG tablet TAKE 1 TABLET(10 MG) BY MOUTH DAILY 90 tablet 0   No facility-administered medications prior to visit.     ROS Review of Systems  Constitutional: Negative for activity change, appetite change and fatigue.  HENT: Negative for congestion, sinus pressure and sore throat.   Eyes: Negative for visual disturbance.  Respiratory: Negative for cough, chest tightness, shortness of breath and wheezing.   Cardiovascular: Negative for chest pain and palpitations.  Gastrointestinal: Negative for abdominal distention, abdominal pain and constipation.  Endocrine: Negative for polydipsia.  Genitourinary: Negative for dysuria and frequency.  Musculoskeletal: Negative for arthralgias and back pain.  Skin: Positive for color change. Negative for rash.  Neurological: Negative for tremors, light-headedness and numbness.  Hematological: Does not bruise/bleed easily.  Psychiatric/Behavioral: Negative for agitation and behavioral problems.    Objective:  BP (!) 166/88   Pulse 76   Temp 98.5 F (36.9 C) (Oral)   Ht _0  (1.676 m)   Wt 128 lb (58.1 kg)   SpO2 96%   BMI 20.66 kg/m   BP/Weight 07/23/2017 12/26/2016 07/14/8339  Systolic BP 962  767 209  Diastolic BP 88 88 96  Wt. (Lbs) 128 - 130.8  BMI 20.66 - 21.11      Physical Exam  Constitutional: She is oriented to person, place, and time. She appears well-developed and well-nourished.  Cardiovascular: Normal rate, normal heart sounds and intact distal pulses.  No murmur heard. Pulmonary/Chest: Effort normal and breath sounds normal. She has no wheezes. She has no rales. She exhibits no tenderness. Right breast exhibits no mass and no  tenderness. Left breast exhibits no mass and no tenderness.  Abdominal: Soft. Bowel sounds are normal. She exhibits no distension and no mass. There is no tenderness.  Musculoskeletal: Normal range of motion.  Neurological: She is alert and oriented to person, place, and time.  Skin:  Areas of vitiligo with random distribution     CMP Latest Ref Rng & Units 03/10/2016 06/27/2015  Glucose 65 - 99 mg/dL 76 334(H)  BUN 7 - 25 mg/dL 8 9  Creatinine 0.50 - 1.10 mg/dL 0.72 0.86  Sodium 135 - 146 mmol/L 139 139  Potassium 3.5 - 5.3 mmol/L 4.3 3.9  Chloride 98 - 110 mmol/L 106 100(L)  CO2 20 - 31 mmol/L 28 24  Calcium 8.6 - 10.2 mg/dL 9.3 9.9  Total Protein 6.5 - 8.1 g/dL - 8.9(H)  Total Bilirubin 0.3 - 1.2 mg/dL - 0.8  Alkaline Phos 38 - 126 U/L - 132(H)  AST 15 - 41 U/L - 29  ALT 14 - 54 U/L - 20    CBC    Component Value Date/Time   WBC 4.9 06/27/2015 1220   RBC 5.54 (H) 06/27/2015 1220   HGB 9.6 (L) 06/27/2015 1220   HCT 33.0 (L) 06/27/2015 1220   PLT 274 06/27/2015 1220   MCV 59.6 (L) 06/27/2015 1220   MCH 17.3 (L) 06/27/2015 1220   MCHC 29.1 (L) 06/27/2015 1220   RDW 20.0 (H) 06/27/2015 1220   LYMPHSABS 1.7 06/27/2015 1220   MONOABS 0.4 06/27/2015 1220   EOSABS 0.1 06/27/2015 1220   BASOSABS 0.0 06/27/2015 1220    Lab Results  Component Value Date   HGBA1C 9.1 07/23/2017    Assessment & Plan:   1. Type 1 diabetes mellitus without complication (HCC) Uncontrolled with A1c of 9.1 which has trended up from 7.6 previously Increase NovoLog 70/30 dose Encouraged to comply with diabetic diet, lifestyle modifications - POCT glucose (manual entry) - POCT glycosylated hemoglobin (Hb A1C) - lisinopril (PRINIVIL,ZESTRIL) 20 MG tablet; TAKE 1 TABLET(10 MG) BY MOUTH DAILY  Dispense: 90 tablet; Refill: 1 - CMP14+EGFR - insulin aspart protamine - aspart (NOVOLOG 70/30 MIX) (70-30) 100 UNIT/ML FlexPen; Inject 0.15 mLs (15 Units total) into the skin 2 (two) times daily with a meal.   Dispense: 5 pen; Refill: 3  2. Essential hypertension Uncontrolled due to not taking medication this morning Refill lisinopril Low-sodium diet  3. Vitiligo Stable  4. Anemia, unspecified type Currently taking OTC iron pills Last hemoglobin in 06/2015 was 9.6 We will prescribe ferrous sulfate if still anemic - CBC with Differential/Platelet  5. Family history of breast cancer Mom diagnosed with breast cancer at age of 15 Clinical breast exam unrevealing today Patient will need mammogram starting at 75   Meds ordered this encounter  Medications  . lisinopril (PRINIVIL,ZESTRIL) 20 MG tablet    Sig: TAKE 1 TABLET(10 MG) BY MOUTH DAILY    Dispense:  90 tablet    Refill:  1  . insulin aspart protamine - aspart (NOVOLOG 70/30 MIX) (70-30) 100  UNIT/ML FlexPen    Sig: Inject 0.15 mLs (15 Units total) into the skin 2 (two) times daily with a meal.    Dispense:  5 pen    Refill:  3    Patient requests 90 day supply: Discontinue previous dose    Follow-up: Return in about 3 months (around 10/23/2017) for Follow-up of diabetes mellitus.   Charlott Rakes MD

## 2017-07-23 NOTE — Patient Instructions (Signed)
Diabetes Mellitus and Nutrition When you have diabetes (diabetes mellitus), it is very important to have healthy eating habits because your blood sugar (glucose) levels are greatly affected by what you eat and drink. Eating healthy foods in the appropriate amounts, at about the same times every day, can help you:  Control your blood glucose.  Lower your risk of heart disease.  Improve your blood pressure.  Reach or maintain a healthy weight.  Every person with diabetes is different, and each person has different needs for a meal plan. Your health care provider may recommend that you work with a diet and nutrition specialist (dietitian) to make a meal plan that is best for you. Your meal plan may vary depending on factors such as:  The calories you need.  The medicines you take.  Your weight.  Your blood glucose, blood pressure, and cholesterol levels.  Your activity level.  Other health conditions you have, such as heart or kidney disease.  How do carbohydrates affect me? Carbohydrates affect your blood glucose level more than any other type of food. Eating carbohydrates naturally increases the amount of glucose in your blood. Carbohydrate counting is a method for keeping track of how many carbohydrates you eat. Counting carbohydrates is important to keep your blood glucose at a healthy level, especially if you use insulin or take certain oral diabetes medicines. It is important to know how many carbohydrates you can safely have in each meal. This is different for every person. Your dietitian can help you calculate how many carbohydrates you should have at each meal and for snack. Foods that contain carbohydrates include:  Bread, cereal, rice, pasta, and crackers.  Potatoes and corn.  Peas, beans, and lentils.  Milk and yogurt.  Fruit and juice.  Desserts, such as cakes, cookies, ice cream, and candy.  How does alcohol affect me? Alcohol can cause a sudden decrease in blood  glucose (hypoglycemia), especially if you use insulin or take certain oral diabetes medicines. Hypoglycemia can be a life-threatening condition. Symptoms of hypoglycemia (sleepiness, dizziness, and confusion) are similar to symptoms of having too much alcohol. If your health care provider says that alcohol is safe for you, follow these guidelines:  Limit alcohol intake to no more than 1 drink per day for nonpregnant women and 2 drinks per day for men. One drink equals 12 oz of beer, 5 oz of wine, or 1 oz of hard liquor.  Do not drink on an empty stomach.  Keep yourself hydrated with water, diet soda, or unsweetened iced tea.  Keep in mind that regular soda, juice, and other mixers may contain a lot of sugar and must be counted as carbohydrates.  What are tips for following this plan? Reading food labels  Start by checking the serving size on the label. The amount of calories, carbohydrates, fats, and other nutrients listed on the label are based on one serving of the food. Many foods contain more than one serving per package.  Check the total grams (g) of carbohydrates in one serving. You can calculate the number of servings of carbohydrates in one serving by dividing the total carbohydrates by 15. For example, if a food has 30 g of total carbohydrates, it would be equal to 2 servings of carbohydrates.  Check the number of grams (g) of saturated and trans fats in one serving. Choose foods that have low or no amount of these fats.  Check the number of milligrams (mg) of sodium in one serving. Most people   should limit total sodium intake to less than 2,300 mg per day.  Always check the nutrition information of foods labeled as "low-fat" or "nonfat". These foods may be higher in added sugar or refined carbohydrates and should be avoided.  Talk to your dietitian to identify your daily goals for nutrients listed on the label. Shopping  Avoid buying canned, premade, or processed foods. These  foods tend to be high in fat, sodium, and added sugar.  Shop around the outside edge of the grocery store. This includes fresh fruits and vegetables, bulk grains, fresh meats, and fresh dairy. Cooking  Use low-heat cooking methods, such as baking, instead of high-heat cooking methods like deep frying.  Cook using healthy oils, such as olive, canola, or sunflower oil.  Avoid cooking with butter, cream, or high-fat meats. Meal planning  Eat meals and snacks regularly, preferably at the same times every day. Avoid going long periods of time without eating.  Eat foods high in fiber, such as fresh fruits, vegetables, beans, and whole grains. Talk to your dietitian about how many servings of carbohydrates you can eat at each meal.  Eat 4-6 ounces of lean protein each day, such as lean meat, chicken, fish, eggs, or tofu. 1 ounce is equal to 1 ounce of meat, chicken, or fish, 1 egg, or 1/4 cup of tofu.  Eat some foods each day that contain healthy fats, such as avocado, nuts, seeds, and fish. Lifestyle   Check your blood glucose regularly.  Exercise at least 30 minutes 5 or more days each week, or as told by your health care provider.  Take medicines as told by your health care provider.  Do not use any products that contain nicotine or tobacco, such as cigarettes and e-cigarettes. If you need help quitting, ask your health care provider.  Work with a counselor or diabetes educator to identify strategies to manage stress and any emotional and social challenges. What are some questions to ask my health care provider?  Do I need to meet with a diabetes educator?  Do I need to meet with a dietitian?  What number can I call if I have questions?  When are the best times to check my blood glucose? Where to find more information:  American Diabetes Association: diabetes.org/food-and-fitness/food  Academy of Nutrition and Dietetics:  www.eatright.org/resources/health/diseases-and-conditions/diabetes  National Institute of Diabetes and Digestive and Kidney Diseases (NIH): www.niddk.nih.gov/health-information/diabetes/overview/diet-eating-physical-activity Summary  A healthy meal plan will help you control your blood glucose and maintain a healthy lifestyle.  Working with a diet and nutrition specialist (dietitian) can help you make a meal plan that is best for you.  Keep in mind that carbohydrates and alcohol have immediate effects on your blood glucose levels. It is important to count carbohydrates and to use alcohol carefully. This information is not intended to replace advice given to you by your health care provider. Make sure you discuss any questions you have with your health care provider. Document Released: 12/01/2004 Document Revised: 04/10/2016 Document Reviewed: 04/10/2016 Elsevier Interactive Patient Education  2018 Elsevier Inc.  

## 2017-07-24 ENCOUNTER — Other Ambulatory Visit: Payer: Self-pay | Admitting: Family Medicine

## 2017-07-24 ENCOUNTER — Other Ambulatory Visit: Payer: Self-pay | Admitting: Internal Medicine

## 2017-07-24 LAB — CMP14+EGFR
ALT: 18 IU/L (ref 0–32)
AST: 21 IU/L (ref 0–40)
Albumin/Globulin Ratio: 1.5 (ref 1.2–2.2)
Albumin: 4.5 g/dL (ref 3.5–5.5)
Alkaline Phosphatase: 108 IU/L (ref 39–117)
BUN / CREAT RATIO: 14 (ref 9–23)
BUN: 10 mg/dL (ref 6–20)
Bilirubin Total: <0.2 mg/dL (ref 0.0–1.2)
CHLORIDE: 99 mmol/L (ref 96–106)
CO2: 26 mmol/L (ref 20–29)
Calcium: 9.8 mg/dL (ref 8.7–10.2)
Creatinine, Ser: 0.74 mg/dL (ref 0.57–1.00)
GFR calc Af Amer: 134 mL/min/{1.73_m2} (ref >59)
GFR calc non Af Amer: 116 mL/min/{1.73_m2} (ref >59)
GLOBULIN, TOTAL: 3 g/dL (ref 1.5–4.5)
Glucose: 271 mg/dL — ABNORMAL HIGH (ref 65–99)
POTASSIUM: 4.3 mmol/L (ref 3.5–5.2)
SODIUM: 139 mmol/L (ref 134–144)
Total Protein: 7.5 g/dL (ref 6.0–8.5)

## 2017-07-24 LAB — CBC WITH DIFFERENTIAL/PLATELET
BASOS ABS: 0 10*3/uL (ref 0.0–0.2)
Basos: 0 %
EOS (ABSOLUTE): 0.1 10*3/uL (ref 0.0–0.4)
Eos: 1 %
HEMATOCRIT: 31.1 % — AB (ref 34.0–46.6)
HEMOGLOBIN: 9.1 g/dL — AB (ref 11.1–15.9)
Immature Grans (Abs): 0 10*3/uL (ref 0.0–0.1)
Immature Granulocytes: 0 %
LYMPHS ABS: 1.8 10*3/uL (ref 0.7–3.1)
LYMPHS: 38 %
MCH: 19.4 pg — AB (ref 26.6–33.0)
MCHC: 29.3 g/dL — AB (ref 31.5–35.7)
MCV: 66 fL — ABNORMAL LOW (ref 79–97)
MONOS ABS: 0.5 10*3/uL (ref 0.1–0.9)
Monocytes: 10 %
NEUTROS PCT: 51 %
Neutrophils Absolute: 2.4 10*3/uL (ref 1.4–7.0)
Platelets: 353 10*3/uL (ref 150–379)
RBC: 4.69 x10E6/uL (ref 3.77–5.28)
RDW: 19.1 % — AB (ref 12.3–15.4)
WBC: 4.7 10*3/uL (ref 3.4–10.8)

## 2017-07-24 MED ORDER — FERROUS SULFATE 325 (65 FE) MG PO TABS: 325.0000 mg | ORAL_TABLET | Freq: Two times a day (BID) | ORAL | 3 refills | Status: DC

## 2017-09-21 ENCOUNTER — Other Ambulatory Visit: Payer: Self-pay | Admitting: Family Medicine

## 2017-09-23 ENCOUNTER — Encounter (HOSPITAL_COMMUNITY): Payer: Self-pay | Admitting: Emergency Medicine

## 2017-09-23 ENCOUNTER — Other Ambulatory Visit: Payer: Self-pay

## 2017-09-23 ENCOUNTER — Ambulatory Visit (HOSPITAL_COMMUNITY)
Admission: EM | Admit: 2017-09-23 | Discharge: 2017-09-23 | Disposition: A | Discharge status: Home / Self Care | Payer: Medicaid Other | Attending: Family Medicine | Admitting: Family Medicine

## 2017-09-23 DIAGNOSIS — I10 Essential (primary) hypertension: Secondary | ICD-10-CM

## 2017-09-23 MED ORDER — LISINOPRIL-HYDROCHLOROTHIAZIDE 20-12.5 MG PO TABS: 1.0000 | ORAL_TABLET | Freq: Every day | ORAL | 0 refills | Status: DC

## 2017-09-23 NOTE — Discharge Instructions (Addendum)
I have prescribed lisinopril with hydrochlorothiazide.  This is a little stronger blood pressure pill.  Take this instead of the plain lisinopril (do not take together.) Continue to check your blood pressure daily. Call your family doctor for follow-up, and for refills

## 2017-09-23 NOTE — ED Triage Notes (Signed)
[facility] a [facility]. The patient stated that her BP was 180/113 at home.

## 2017-09-23 NOTE — ED Provider Notes (Signed)
[place]    CSN: [id number] Arrival date & time: 09/23/17  1722     History   Chief Complaint Chief Complaint  Patient presents with  . Hypertension    HPI Morgan Moon is a 22 y.o. female.   HPI  Patient has type 1 diabetes.  Well-controlled per patient and mother.  She also has hypertension.  She takes lisinopril 20 mg a day.  She took her medicine this morning.  She checks her blood pressure every day.  Today when she checked her blood pressure at home it was quite high.  She states at home it was 180/113.  Mother brought her in for evaluation.  She has no headache, visual symptoms, dizziness or nausea.  She has been on the lisinopril 20 mg a day for some time with good control.  She did have bacon for breakfast.  She does not usually eat salty foods.  We did discuss salt could have a bad effect on blood pressure.  Past Medical History:  Diagnosis Date  . Diabetes mellitus without complication ([place])   . Hypertension     Patient Active Problem List   Diagnosis Date Noted  . Anemia 07/23/2017  . Family history of breast cancer 07/23/2017  . Hypertension 07/23/2017  . DM (diabetes mellitus), type 1, uncontrolled ([place]) 06/30/2015  . Vitiligo 06/30/2015  . Systolic murmur 97/35/3299    History reviewed. No pertinent surgical history.  OB History   None      Home Medications    Prior to Admission medications   Medication Sig Start Date End Date Taking? Authorizing Provider  ACCU-CHEK SOFTCLIX LANCETS lancets USE AS INSTRUCTED 12/04/16   Fredia Beets R, FNP  B-D UF III MINI PEN NEEDLES 31G X 5 MM MISC USE AS DIRECTED TWICE DAILY FOR INSULING DOSING 09/21/17   Charlott Rakes, MD  Blood Glucose Monitoring Suppl (ACCU-CHEK AVIVA PLUS) w/Device KIT 1 each by Does not apply route 3 (three) times daily. 06/30/15   Swords, Darrick Penna, MD  ferrous sulfate 325 (65 FE) MG tablet Take 1 tablet (325 mg total) by mouth 2 (two) times daily with a meal. 07/24/17    Newlin, Enobong, MD  glucose blood (ACCU-CHEK AVIVA PLUS) test strip USE AS DIRECTED. 12/04/16   Alfonse Spruce, FNP  ibuprofen (ADVIL,MOTRIN) 600 MG tablet Take 1 tablet (600 mg total) by mouth every 6 (six) hours as needed for moderate pain. 04/09/16   Melony Overly, MD  insulin aspart protamine - aspart (NOVOLOG 70/30 MIX) (70-30) 100 UNIT/ML FlexPen Inject 0.15 mLs (15 Units total) into the skin 2 (two) times daily with a meal. 07/23/17   Charlott Rakes, MD  Insulin Syringe-Needle U-100 (BD INSULIN SYRINGE ULTRAFINE) 31G X 15/64" 0.5 ML MISC Use as directed twice daily. 12/04/16   Alfonse Spruce, FNP  IRON PO Take by mouth.    [provider]  lisinopril-hydrochlorothiazide (ZESTORETIC) 20-12.5 MG tablet Take 1 tablet by mouth daily. 09/23/17   Raylene Everts, MD    Family History Family History  Problem Relation Age of Onset  . Hypertension Maternal Aunt   . Hypertension Maternal Uncle   . Hypertension Maternal Grandmother   . Hypertension Maternal Grandfather     Social History Social History   Tobacco Use  . Smoking status: Never Smoker  . Smokeless tobacco: Never Used  Substance Use Topics  . Alcohol use: No  . Drug use: No     Allergies   Amlodipine  Review of Systems Review of Systems  Constitutional: Negative for chills and fever.  HENT: Negative for ear pain and sore throat.   Eyes: Negative for pain and visual disturbance.  Respiratory: Negative for cough and shortness of breath.   Cardiovascular: Negative for chest pain and palpitations.  Gastrointestinal: Negative for abdominal pain and vomiting.  Genitourinary: Negative for dysuria and hematuria.  Musculoskeletal: Negative for arthralgias and back pain.  Skin: Negative for color change and rash.  Neurological: Negative for seizures and syncope.  All other systems reviewed and are negative.    Physical Exam Triage Vital Signs ED Triage Vitals [09/23/17 1727]  Enc Vitals Group      BP (!) 180/98     Pulse Rate 95     Resp 16     Temp 98.4 F (36.9 C)     Temp Source Oral     SpO2 100 %     Weight      Height      Head Circumference      Peak Flow      Pain Score 0     Pain Loc      Pain Edu?      Excl. in [place]?    No data found.  Updated Vital Signs BP (!) 180/98 (BP Location: Left Arm)   Pulse 95   Temp 98.4 F (36.9 C) (Oral)   Resp 16   SpO2 100%   Visual Acuity Right Eye Distance:   Left Eye Distance:   Bilateral Distance:    Right Eye Near:   Left Eye Near:    Bilateral Near:     Physical Exam  Constitutional: She appears well-developed and well-nourished. No distress.  HENT:  Head: Normocephalic and atraumatic.  Mouth/Throat: Oropharynx is clear and moist.  Eyes: Pupils are equal, round, and reactive to light. Conjunctivae are normal.  Disks are flat  Neck: Normal range of motion.  Cardiovascular: Normal rate, regular rhythm, normal heart sounds and intact distal pulses.  No murmur heard. Pulmonary/Chest: Effort normal and breath sounds normal. No respiratory distress. She has no wheezes.  Abdominal: Soft. She exhibits no distension.  Musculoskeletal: Normal range of motion. She exhibits no edema.  Neurological: She is alert.  Skin: Skin is warm and dry.   Repeat blood pressure is 158/88  UC Treatments / Results  Labs (all labs ordered are listed, but only abnormal results are displayed) Labs Reviewed - No data to display  EKG None  Radiology No results found.  Procedures Procedures (including critical care time)  Medications Ordered in UC Medications - No data to display  Initial Impression / Assessment and Plan / UC Course  I have reviewed the triage vital signs and the nursing notes.  Pertinent labs & imaging results that were available during my care of the patient were reviewed by me and considered in my medical decision making (see chart for details).      Final Clinical Impressions(s) / UC Diagnoses    Final diagnoses:  Essential hypertension     Discharge Instructions     I have prescribed lisinopril with hydrochlorothiazide.  This is a little stronger blood pressure pill.  Take this instead of the plain lisinopril (do not take together.) Continue to check your blood pressure daily. Call your family doctor for follow-up, and for refills   ED Prescriptions    Medication Sig Dispense Auth. Provider   lisinopril-hydrochlorothiazide (ZESTORETIC) 20-12.5 MG tablet Take 1 tablet by mouth daily. 90 tablet  Raylene Everts, MD     Controlled Substance Prescriptions [place] Controlled Substance Registry consulted? Not Applicable   Raylene Everts, MD 09/23/17 [phone]

## 2017-10-23 ENCOUNTER — Encounter: Payer: Self-pay | Admitting: Family Medicine

## 2017-10-23 ENCOUNTER — Ambulatory Visit: Payer: Medicaid Other | Attending: Family Medicine | Admitting: Family Medicine

## 2017-10-23 VITALS — BP 128/86 | HR 96 | Temp 98.3°F | Ht 66.0 in | Wt 129.0 lb

## 2017-10-23 DIAGNOSIS — Z79899 Other long term (current) drug therapy: Secondary | ICD-10-CM | POA: Insufficient documentation

## 2017-10-23 DIAGNOSIS — Z794 Long term (current) use of insulin: Secondary | ICD-10-CM | POA: Diagnosis not present

## 2017-10-23 DIAGNOSIS — E109 Type 1 diabetes mellitus without complications: Secondary | ICD-10-CM | POA: Insufficient documentation

## 2017-10-23 DIAGNOSIS — D649 Anemia, unspecified: Secondary | ICD-10-CM | POA: Diagnosis not present

## 2017-10-23 DIAGNOSIS — E10649 Type 1 diabetes mellitus with hypoglycemia without coma: Secondary | ICD-10-CM | POA: Diagnosis not present

## 2017-10-23 DIAGNOSIS — I10 Essential (primary) hypertension: Secondary | ICD-10-CM | POA: Insufficient documentation

## 2017-10-23 DIAGNOSIS — L80 Vitiligo: Secondary | ICD-10-CM | POA: Insufficient documentation

## 2017-10-23 LAB — POCT GLYCOSYLATED HEMOGLOBIN (HGB A1C): HBA1C, POC (CONTROLLED DIABETIC RANGE): 8.3 % — AB (ref 0.0–7.0)

## 2017-10-23 LAB — GLUCOSE, POCT (MANUAL RESULT ENTRY): POC GLUCOSE: 110 mg/dL — AB (ref 70–99)

## 2017-10-23 MED ORDER — INSULIN ASPART 100 UNIT/ML ~~LOC~~ SOLN: SUBCUTANEOUS | 3 refills | Status: DC

## 2017-10-23 MED ORDER — INSULIN ASPART PROT & ASPART (70-30 MIX) 100 UNIT/ML PEN: 15.0000 [IU] | PEN_INJECTOR | Freq: Two times a day (BID) | SUBCUTANEOUS | 3 refills | Status: DC

## 2017-10-23 MED ORDER — LISINOPRIL-HYDROCHLOROTHIAZIDE 20-12.5 MG PO TABS: 1.0000 | ORAL_TABLET | Freq: Every day | ORAL | 1 refills | Status: DC

## 2017-10-23 NOTE — Progress Notes (Signed)
Subjective:  Patient ID: Morgan Moon, female    DOB: 1996/03/11  Age: 22 y.o. MRN: [id number]  CC: Diabetes and Hypertension   HPI Morgan Moon is a 22 year old female with a history of type 1 diabetes mellitus (A1c 8.3), hypertension, vitiligo, and anemia who presents today for a follow-up visit accompanied by her mother. Her A1c is 8.3 which has improved from 9.1 previously and she endorses working on a diabetic diet and exercising and has been compliant with her insulin.  She has had one episode of a low sugar at 71 which occurred at 5 PM yesterday and was asymptomatic but other than that she denies other hypoglycemic episodes.  She is up-to-date on annual eye exams and denies numbness in extremities. She had an urgent care visit due to elevated blood pressure and her lisinopril was switched to lisinopril/HCTZ which she reports doing well on. She has no acute concerns at this time.  Past Medical History:  Diagnosis Date  . Diabetes mellitus without complication ([place])   . Hypertension     History reviewed. No pertinent surgical history.  Allergies  Allergen Reactions  . Amlodipine Other (See Comments)    headaches     Outpatient Medications Prior to Visit  Medication Sig Dispense Refill  . ACCU-CHEK SOFTCLIX LANCETS lancets USE AS INSTRUCTED 300 each 3  . B-D UF III MINI PEN NEEDLES 31G X 5 MM MISC USE AS DIRECTED TWICE DAILY FOR INSULING DOSING 100 each 2  . Blood Glucose Monitoring Suppl (ACCU-CHEK AVIVA PLUS) w/Device KIT 1 each by Does not apply route 3 (three) times daily. 1 kit 0  . ferrous sulfate 325 (65 FE) MG tablet Take 1 tablet (325 mg total) by mouth 2 (two) times daily with a meal. 60 tablet 3  . glucose blood (ACCU-CHEK AVIVA PLUS) test strip USE AS DIRECTED. 300 each 3  . ibuprofen (ADVIL,MOTRIN) 600 MG tablet Take 1 tablet (600 mg total) by mouth every 6 (six) hours as needed for moderate pain. 30 tablet 0  . Insulin Syringe-Needle U-100 (BD INSULIN  SYRINGE ULTRAFINE) 31G X 15/64" 0.5 ML MISC Use as directed twice daily. 300 each 3  . IRON PO Take by mouth.    . insulin aspart protamine - aspart (NOVOLOG 70/30 MIX) (70-30) 100 UNIT/ML FlexPen Inject 0.15 mLs (15 Units total) into the skin 2 (two) times daily with a meal. 5 pen 3  . lisinopril-hydrochlorothiazide (ZESTORETIC) 20-12.5 MG tablet Take 1 tablet by mouth daily. 90 tablet 0   No facility-administered medications prior to visit.     ROS Review of Systems  Constitutional: Negative for activity change, appetite change and fatigue.  HENT: Negative for congestion, sinus pressure and sore throat.   Eyes: Negative for visual disturbance.  Respiratory: Negative for cough, chest tightness, shortness of breath and wheezing.   Cardiovascular: Negative for chest pain and palpitations.  Gastrointestinal: Negative for abdominal distention, abdominal pain and constipation.  Endocrine: Negative for polydipsia.  Genitourinary: Negative for dysuria and frequency.  Musculoskeletal: Negative for arthralgias and back pain.  Skin: Negative for rash.  Neurological: Negative for tremors, light-headedness and numbness.  Hematological: Does not bruise/bleed easily.  Psychiatric/Behavioral: Negative for agitation and behavioral problems.    Objective:  BP 128/86   Pulse 96   Temp 98.3 F (36.8 C) (Oral)   Ht '5\' 6"'$  (1.676 m)   Wt 129 lb (58.5 kg)   SpO2 97%   BMI 20.82 kg/m   BP/Weight 10/23/2017 09/23/2017 07/23/2017  Systolic BP 732 202 542  Diastolic BP 86 98 88  Wt. (Lbs) 129 - 128  BMI 20.82 - 20.66      Physical Exam  Constitutional: She is oriented to person, place, and time. She appears well-developed and well-nourished.  Cardiovascular: Normal rate, normal heart sounds and intact distal pulses.  No murmur heard. Pulmonary/Chest: Effort normal and breath sounds normal. She has no wheezes. She has no rales. She exhibits no tenderness.  Abdominal: Soft. Bowel sounds are normal.  She exhibits no distension and no mass. There is no tenderness.  Musculoskeletal: Normal range of motion.  Neurological: She is alert and oriented to person, place, and time.  Skin:  Vitiligo distributed randomly on entire body surface  Psychiatric: She has a normal mood and affect.     CMP Latest Ref Rng & Units 07/23/2017 03/10/2016 06/27/2015  Glucose 65 - 99 mg/dL 271(H) 76 334(H)  BUN 6 - 20 mg/dL '10 8 9  '$ Creatinine 0.57 - 1.00 mg/dL 0.74 0.72 0.86  Sodium 134 - 144 mmol/L 139 139 139  Potassium 3.5 - 5.2 mmol/L 4.3 4.3 3.9  Chloride 96 - 106 mmol/L 99 106 100(L)  CO2 20 - 29 mmol/L '26 28 24  '$ Calcium 8.7 - 10.2 mg/dL 9.8 9.3 9.9  Total Protein 6.0 - 8.5 g/dL 7.5 - 8.9(H)  Total Bilirubin 0.0 - 1.2 mg/dL <0.2 - 0.8  Alkaline Phos 39 - 117 IU/L 108 - 132(H)  AST 0 - 40 IU/L 21 - 29  ALT 0 - 32 IU/L 18 - 20     Lab Results  Component Value Date   HGBA1C 8.3 (A) 10/23/2017    Assessment & Plan:   1. Type 1 diabetes mellitus without complication (HCC) Improved with A1c of 8.3 which has trended down from 9.1 previously NovoLog sliding scale added to regimen; will hold off on increasing NovoLog 70/30 dose to prevent hypoglycemia We have discussed the option of an insulin pump which she is not interested in at this time Counseled on Diabetic diet, my plate method, 706 minutes of moderate intensity exercise/week Keep blood sugar logs with fasting goals of 80-120 mg/dl, random of less than 180 and in the event of sugars less than 60 mg/dl or greater than 400 mg/dl please notify the clinic ASAP. It is recommended that you undergo annual eye exams and annual foot exams. Pneumonia vaccine is recommended. - POCT glucose (manual entry) - POCT glycosylated hemoglobin (Hb A1C) - insulin aspart protamine - aspart (NOVOLOG 70/30 MIX) (70-30) 100 UNIT/ML FlexPen; Inject 0.15 mLs (15 Units total) into the skin 2 (two) times daily with a meal.  Dispense: 5 pen; Refill: 3 - insulin aspart  (NOVOLOG) 100 UNIT/ML injection; 0 to 12 units subcutaneously 3 times daily before meals as per sliding scale  Dispense: 3 vial; Refill: 3  2. Vitiligo Stable  3. Essential hypertension Controlled Counseled on blood pressure goal of less than 130/80, low-sodium, DASH diet, medication compliance, 150 minutes of moderate intensity exercise per week. Discussed medication compliance, adverse effects. - lisinopril-hydrochlorothiazide (ZESTORETIC) 20-12.5 MG tablet; Take 1 tablet by mouth daily.  Dispense: 90 tablet; Refill: 1   Meds ordered this encounter  Medications  . lisinopril-hydrochlorothiazide (ZESTORETIC) 20-12.5 MG tablet    Sig: Take 1 tablet by mouth daily.    Dispense:  90 tablet    Refill:  1  . insulin aspart protamine - aspart (NOVOLOG 70/30 MIX) (70-30) 100 UNIT/ML FlexPen    Sig: Inject 0.15 mLs (15 Units  total) into the skin 2 (two) times daily with a meal.    Dispense:  5 pen    Refill:  3    Patient requests 90 day supply: Discontinue previous dose  . insulin aspart (NOVOLOG) 100 UNIT/ML injection    Sig: 0 to 12 units subcutaneously 3 times daily before meals as per sliding scale    Dispense:  3 vial    Refill:  3    Follow-up: Return in about 3 months (around 01/23/2018) for Follow-up of chronic medical conditions.   Charlott Rakes MD

## 2017-10-23 NOTE — Patient Instructions (Signed)
Diabetes Mellitus and Nutrition When you have diabetes (diabetes mellitus), it is very important to have healthy eating habits because your blood sugar (glucose) levels are greatly affected by what you eat and drink. Eating healthy foods in the appropriate amounts, at about the same times every day, can help you:  Control your blood glucose.  Lower your risk of heart disease.  Improve your blood pressure.  Reach or maintain a healthy weight.  Every person with diabetes is different, and each person has different needs for a meal plan. Your health care provider may recommend that you work with a diet and nutrition specialist (dietitian) to make a meal plan that is best for you. Your meal plan may vary depending on factors such as:  The calories you need.  The medicines you take.  Your weight.  Your blood glucose, blood pressure, and cholesterol levels.  Your activity level.  Other health conditions you have, such as heart or kidney disease.  How do carbohydrates affect me? Carbohydrates affect your blood glucose level more than any other type of food. Eating carbohydrates naturally increases the amount of glucose in your blood. Carbohydrate counting is a method for keeping track of how many carbohydrates you eat. Counting carbohydrates is important to keep your blood glucose at a healthy level, especially if you use insulin or take certain oral diabetes medicines. It is important to know how many carbohydrates you can safely have in each meal. This is different for every person. Your dietitian can help you calculate how many carbohydrates you should have at each meal and for snack. Foods that contain carbohydrates include:  Bread, cereal, rice, pasta, and crackers.  Potatoes and corn.  Peas, beans, and lentils.  Milk and yogurt.  Fruit and juice.  Desserts, such as cakes, cookies, ice cream, and candy.  How does alcohol affect me? Alcohol can cause a sudden decrease in blood  glucose (hypoglycemia), especially if you use insulin or take certain oral diabetes medicines. Hypoglycemia can be a life-threatening condition. Symptoms of hypoglycemia (sleepiness, dizziness, and confusion) are similar to symptoms of having too much alcohol. If your health care provider says that alcohol is safe for you, follow these guidelines:  Limit alcohol intake to no more than 1 drink per day for nonpregnant women and 2 drinks per day for men. One drink equals 12 oz of beer, 5 oz of wine, or 1 oz of hard liquor.  Do not drink on an empty stomach.  Keep yourself hydrated with water, diet soda, or unsweetened iced tea.  Keep in mind that regular soda, juice, and other mixers may contain a lot of sugar and must be counted as carbohydrates.  What are tips for following this plan? Reading food labels  Start by checking the serving size on the label. The amount of calories, carbohydrates, fats, and other nutrients listed on the label are based on one serving of the food. Many foods contain more than one serving per package.  Check the total grams (g) of carbohydrates in one serving. You can calculate the number of servings of carbohydrates in one serving by dividing the total carbohydrates by 15. For example, if a food has 30 g of total carbohydrates, it would be equal to 2 servings of carbohydrates.  Check the number of grams (g) of saturated and trans fats in one serving. Choose foods that have low or no amount of these fats.  Check the number of milligrams (mg) of sodium in one serving. Most people   should limit total sodium intake to less than 2,300 mg per day.  Always check the nutrition information of foods labeled as "low-fat" or "nonfat". These foods may be higher in added sugar or refined carbohydrates and should be avoided.  Talk to your dietitian to identify your daily goals for nutrients listed on the label. Shopping  Avoid buying canned, premade, or processed foods. These  foods tend to be high in fat, sodium, and added sugar.  Shop around the outside edge of the grocery store. This includes fresh fruits and vegetables, bulk grains, fresh meats, and fresh dairy. Cooking  Use low-heat cooking methods, such as baking, instead of high-heat cooking methods like deep frying.  Cook using healthy oils, such as olive, canola, or sunflower oil.  Avoid cooking with butter, cream, or high-fat meats. Meal planning  Eat meals and snacks regularly, preferably at the same times every day. Avoid going long periods of time without eating.  Eat foods high in fiber, such as fresh fruits, vegetables, beans, and whole grains. Talk to your dietitian about how many servings of carbohydrates you can eat at each meal.  Eat 4-6 ounces of lean protein each day, such as lean meat, chicken, fish, eggs, or tofu. 1 ounce is equal to 1 ounce of meat, chicken, or fish, 1 egg, or 1/4 cup of tofu.  Eat some foods each day that contain healthy fats, such as avocado, nuts, seeds, and fish. Lifestyle   Check your blood glucose regularly.  Exercise at least 30 minutes 5 or more days each week, or as told by your health care provider.  Take medicines as told by your health care provider.  Do not use any products that contain nicotine or tobacco, such as cigarettes and e-cigarettes. If you need help quitting, ask your health care provider.  Work with a counselor or diabetes educator to identify strategies to manage stress and any emotional and social challenges. What are some questions to ask my health care provider?  Do I need to meet with a diabetes educator?  Do I need to meet with a dietitian?  What number can I call if I have questions?  When are the best times to check my blood glucose? Where to find more information:  American Diabetes Association: diabetes.org/food-and-fitness/food  Academy of Nutrition and Dietetics:  www.eatright.org/resources/health/diseases-and-conditions/diabetes  National Institute of Diabetes and Digestive and Kidney Diseases (NIH): www.niddk.nih.gov/health-information/diabetes/overview/diet-eating-physical-activity Summary  A healthy meal plan will help you control your blood glucose and maintain a healthy lifestyle.  Working with a diet and nutrition specialist (dietitian) can help you make a meal plan that is best for you.  Keep in mind that carbohydrates and alcohol have immediate effects on your blood glucose levels. It is important to count carbohydrates and to use alcohol carefully. This information is not intended to replace advice given to you by your health care provider. Make sure you discuss any questions you have with your health care provider. Document Released: 12/01/2004 Document Revised: 04/10/2016 Document Reviewed: 04/10/2016 Elsevier Interactive Patient Education  2018 Elsevier Inc.  

## 2017-10-29 ENCOUNTER — Telehealth: Payer: Self-pay | Admitting: Family Medicine

## 2017-10-29 ENCOUNTER — Other Ambulatory Visit: Payer: Self-pay

## 2017-10-29 DIAGNOSIS — E109 Type 1 diabetes mellitus without complications: Secondary | ICD-10-CM

## 2017-10-29 MED ORDER — ACCU-CHEK AVIVA PLUS W/DEVICE KIT: 1.0000 | PACK | Freq: Three times a day (TID) | 0 refills | Status: DC

## 2017-10-29 MED ORDER — ACCU-CHEK SOFTCLIX LANCETS MISC: 3 refills | Status: DC

## 2017-10-29 MED ORDER — GLUCOSE BLOOD VI STRP: ORAL_STRIP | 3 refills | Status: DC

## 2017-10-29 MED FILL — ACCU-CHEK AVIVA PLUS METER: W/DEVICE | 30 days supply | Qty: 1 | Fill #0

## 2017-10-30 MED FILL — ACCU-CHEK AVIVA PLUS TEST S: 30 days supply | Qty: 100 | Fill #0

## 2017-10-30 MED FILL — ACCU-CHEK FASTCLIX LANCETS: 30 days supply | Qty: 102 | Fill #0

## 2018-01-23 ENCOUNTER — Ambulatory Visit: Payer: Medicaid Other | Admitting: Family Medicine

## 2018-02-12 ENCOUNTER — Other Ambulatory Visit: Payer: Self-pay

## 2018-02-12 MED FILL — ACCU-CHEK AVIVA PLUS TEST S: 30 days supply | Qty: 100 | Fill #1

## 2018-02-12 MED FILL — ACCU-CHEK FASTCLIX LANCETS: 30 days supply | Qty: 102 | Fill #1

## 2018-02-28 ENCOUNTER — Encounter (HOSPITAL_COMMUNITY): Payer: Self-pay | Admitting: Emergency Medicine

## 2018-02-28 ENCOUNTER — Ambulatory Visit (HOSPITAL_COMMUNITY)
Admission: EM | Admit: 2018-02-28 | Discharge: 2018-02-28 | Disposition: A | Discharge status: Home / Self Care | Payer: Medicaid Other | Attending: Emergency Medicine | Admitting: Emergency Medicine

## 2018-02-28 DIAGNOSIS — R110 Nausea: Secondary | ICD-10-CM

## 2018-02-28 LAB — POCT I-STAT, CHEM 8
BUN: 14 mg/dL (ref 6–20)
Calcium, Ion: 1.13 mmol/L — ABNORMAL LOW (ref 1.15–1.40)
Chloride: 100 mmol/L (ref 98–111)
Creatinine, Ser: 0.7 mg/dL (ref 0.44–1.00)
Glucose, Bld: 85 mg/dL (ref 70–99)
HCT: 32 % — ABNORMAL LOW (ref 36.0–46.0)
HEMOGLOBIN: 10.9 g/dL — AB (ref 12.0–15.0)
Potassium: 3.7 mmol/L (ref 3.5–5.1)
Sodium: 137 mmol/L (ref 135–145)
TCO2: 27 mmol/L (ref 22–32)

## 2018-02-28 LAB — POCT PREGNANCY, URINE: Preg Test, Ur: NEGATIVE

## 2018-02-28 LAB — POCT URINALYSIS DIP (DEVICE)
Glucose, UA: NEGATIVE mg/dL
HGB URINE DIPSTICK: NEGATIVE
Ketones, ur: 40 mg/dL — AB
LEUKOCYTES UA: NEGATIVE
Nitrite: NEGATIVE
PH: 5.5 (ref 5.0–8.0)
Protein, ur: 30 mg/dL — AB
Specific Gravity, Urine: >=1.03 (ref 1.005–1.030)
Urobilinogen, UA: 1 mg/dL (ref 0.0–1.0)

## 2018-02-28 MED ORDER — ONDANSETRON 8 MG PO TBDP: ORAL_TABLET | ORAL | 0 refills | Status: DC

## 2018-02-28 NOTE — ED Triage Notes (Signed)
[facility] nausea since Monday with one episode of vomiting Monday upon waking up.  Pt c/o diffuse abdominal pain.

## 2018-02-28 NOTE — ED Provider Notes (Signed)
HPI  SUBJECTIVE:  Morgan Moon is a 22 y.o. female who presents with intermittent episodes of nausea for the past 4 days.  She states that she had one episode of emesis 4 days ago, none since.  She is tolerating p.o. although she reports decreased appetite.  No fevers, abdominal pain, urinary complaints.  No wheezing, chest pain, shortness of breath, cough.  No headache.  No new foods, raw or undercooked foods.  No diarrhea.  She states that she has a niece with a similar illness, but that has resolved.  She has not changed her medications recently.  No URI symptoms, sore throat, neck stiffness, rashes.  She has never had symptoms like this before.  She has tried ginger, Pepto-Bismol which helped for about an hour.  No aggravating factors.  She has a past medical history of type 1 diabetes, states that her glucose has been running in the 100s, hypertension.  No history of DKA.  LMP: 2 weeks ago.  Denies the possibility of being pregnant.  HWT:[id number], Maylon Peppers, FNP     Past Medical History:  Diagnosis Date  . Diabetes mellitus without complication ([place])   . Hypertension     History reviewed. No pertinent surgical history.  Family History  Problem Relation Age of Onset  . Hypertension Maternal Aunt   . Hypertension Maternal Uncle   . Hypertension Maternal Grandmother   . Hypertension Maternal Grandfather     Social History   Tobacco Use  . Smoking status: Never Smoker  . Smokeless tobacco: Never Used  Substance Use Topics  . Alcohol use: No  . Drug use: No    No current facility-administered medications for this encounter.   Current Outpatient Medications:  .  ACCU-CHEK SOFTCLIX LANCETS lancets, USE AS INSTRUCTED, Disp: 300 each, Rfl: 3 .  B-D UF III MINI PEN NEEDLES 31G X 5 MM MISC, USE AS DIRECTED TWICE DAILY FOR INSULING DOSING, Disp: 100 each, Rfl: 2 .  Blood Glucose Monitoring Suppl (ACCU-CHEK AVIVA PLUS) w/Device KIT, 1 each by Does not apply route 3 (three) times  daily., Disp: 1 kit, Rfl: 0 .  ferrous sulfate 325 (65 FE) MG tablet, Take 1 tablet (325 mg total) by mouth 2 (two) times daily with a meal., Disp: 60 tablet, Rfl: 3 .  glucose blood (ACCU-CHEK AVIVA PLUS) test strip, USE AS DIRECTED., Disp: 300 each, Rfl: 3 .  insulin aspart (NOVOLOG) 100 UNIT/ML injection, 0 to 12 units subcutaneously 3 times daily before meals as per sliding scale, Disp: 3 vial, Rfl: 3 .  insulin aspart protamine - aspart (NOVOLOG 70/30 MIX) (70-30) 100 UNIT/ML FlexPen, Inject 0.15 mLs (15 Units total) into the skin 2 (two) times daily with a meal., Disp: 5 pen, Rfl: 3 .  Insulin Syringe-Needle U-100 (BD INSULIN SYRINGE ULTRAFINE) 31G X 15/64" 0.5 ML MISC, Use as directed twice daily., Disp: 300 each, Rfl: 3 .  IRON PO, Take by mouth., Disp: , Rfl:  .  lisinopril-hydrochlorothiazide (ZESTORETIC) 20-12.5 MG tablet, Take 1 tablet by mouth daily., Disp: 90 tablet, Rfl: 1 .  ondansetron (ZOFRAN ODT) 8 MG disintegrating tablet, 1/2- 1 tablet q 8 hr prn nausea, vomiting, Disp: 20 tablet, Rfl: 0  Allergies  Allergen Reactions  . Amlodipine Other (See Comments)    headaches     ROS  As noted in HPI.   Physical Exam  BP (!) 150/92 (BP Location: Left Arm)   Pulse (!) 118   Temp 99.6 F (37.6 C) (Oral)  Resp 16   SpO2 99%   Constitutional: Well developed, well nourished, no acute distress Eyes: PERRL, EOMI, conjunctiva normal bilaterally HENT: Normocephalic, atraumatic,mucus membranes moist.  No nasal congestion.  No sinus tenderness.  Normal oropharynx. Neck: No cervical lymphadenopathy, meningismus. Respiratory: Clear to auscultation bilaterally, no rales, no wheezing, no rhonchi Cardiovascular: Normal rate and rhythm, no murmurs, no gallops, no rubs GI: Soft, nondistended, normal bowel sounds, nontender, no rebound, no guarding Back: no CVAT skin: No rash, skin intact Musculoskeletal: No edema, no tenderness, no deformities Neurologic: Alert & oriented x 3, CN  II-XII grossly intact, no motor deficits, sensation grossly intact Psychiatric: Speech and behavior appropriate   ED Course   Medications - No data to display  Orders Placed This Encounter  Procedures  . I-stat chem 8    Standing Status:   Standing    Number of Occurrences:   1  . POC urine pregnancy    Standing Status:   Standing    Number of Occurrences:   1  . I-STAT, chem 8    Standing Status:   Standing    Number of Occurrences:   1  . POCT urinalysis dip (device)    Standing Status:   Standing    Number of Occurrences:   1  . Pregnancy, urine POC    Standing Status:   Standing    Number of Occurrences:   1   Results for orders placed or performed during the hospital encounter of 02/28/18 (from the past 24 hour(s))  I-STAT, chem 8     Status: Abnormal   Collection Time: 02/28/18  2:40 PM  Result Value Ref Range   Sodium 137 135 - 145 mmol/L   Potassium 3.7 3.5 - 5.1 mmol/L   Chloride 100 98 - 111 mmol/L   BUN 14 6 - 20 mg/dL   Creatinine, Ser 0.70 0.44 - 1.00 mg/dL   Glucose, Bld 85 70 - 99 mg/dL   Calcium, Ion 1.13 (L) 1.15 - 1.40 mmol/L   TCO2 27 22 - 32 mmol/L   Hemoglobin 10.9 (L) 12.0 - 15.0 g/dL   HCT 32.0 (L) 36.0 - 46.0 %  POCT urinalysis dip (device)     Status: Abnormal   Collection Time: 02/28/18  3:04 PM  Result Value Ref Range   Glucose, UA NEGATIVE NEGATIVE mg/dL   Bilirubin Urine SMALL (A) NEGATIVE   Ketones, ur 40 (A) NEGATIVE mg/dL   Specific Gravity, Urine >=1.030 1.005 - 1.030   Hgb urine dipstick NEGATIVE NEGATIVE   pH 5.5 5.0 - 8.0   Protein, ur 30 (A) NEGATIVE mg/dL   Urobilinogen, UA 1.0 0.0 - 1.0 mg/dL   Nitrite NEGATIVE NEGATIVE   Leukocytes, UA NEGATIVE NEGATIVE  Pregnancy, urine POC     Status: None   Collection Time: 02/28/18  3:09 PM  Result Value Ref Range   Preg Test, Ur NEGATIVE NEGATIVE   No results found.  ED Clinical Impression  Nausea   ED Assessment/Plan   Deferring x-ray as she has no respiratory  complaints.  We will check an i-STAT, UA and urine pregnancy.  Heart rate noted.  She states that her heart rate is always elevated when she comes to the doctor, states that it is normal at home  Labs reviewed.  I-STAT normal.  She does have ketones in her urine, but she has no glucose.  Urine pregnancy is negative.  Unsure as to the etiology of her nausea, but not due to DKA, pregnancy,  electrolyte imbalance or kidney failure.  Will send home with Zofran, push electrolyte containing fluids, follow-up with PMD ASAP if not getting any better, to the ER if she gets worse..  Discussed labs,  MDM, treatment plan, and plan for follow-up with patient Discussed sn/sx that should prompt return to the ED. patient agrees with plan.   Meds ordered this encounter  Medications  . ondansetron (ZOFRAN ODT) 8 MG disintegrating tablet    Sig: 1/2- 1 tablet q 8 hr prn nausea, vomiting    Dispense:  20 tablet    Refill:  0    *This clinic note was created using [employer]. Therefore, there may be occasional mistakes despite careful proofreading.  ?    Melynda Ripple, MD 03/01/18 1416

## 2018-02-28 NOTE — Discharge Instructions (Addendum)
Try the Zofran.  It may make you constipated.  Push electrolyte containing fluids such as Pedialyte or Gatorade.  This may help with the nausea.  Your electrolytes, glucose, kidney function were all normal today .  you do not have a urinary tract infection and you are not pregnant.

## 2018-03-14 ENCOUNTER — Ambulatory Visit: Payer: Medicaid Other | Attending: Family Medicine | Admitting: Family Medicine

## 2018-03-14 ENCOUNTER — Encounter: Payer: Self-pay | Admitting: Family Medicine

## 2018-03-14 VITALS — BP 125/70 | HR 110 | Temp 98.4°F | Ht 66.0 in | Wt 128.6 lb

## 2018-03-14 DIAGNOSIS — D649 Anemia, unspecified: Secondary | ICD-10-CM | POA: Insufficient documentation

## 2018-03-14 DIAGNOSIS — Z23 Encounter for immunization: Secondary | ICD-10-CM | POA: Diagnosis not present

## 2018-03-14 DIAGNOSIS — L80 Vitiligo: Secondary | ICD-10-CM | POA: Insufficient documentation

## 2018-03-14 DIAGNOSIS — Z79899 Other long term (current) drug therapy: Secondary | ICD-10-CM | POA: Insufficient documentation

## 2018-03-14 DIAGNOSIS — Z888 Allergy status to other drugs, medicaments and biological substances status: Secondary | ICD-10-CM | POA: Insufficient documentation

## 2018-03-14 DIAGNOSIS — E109 Type 1 diabetes mellitus without complications: Secondary | ICD-10-CM | POA: Diagnosis not present

## 2018-03-14 DIAGNOSIS — Z794 Long term (current) use of insulin: Secondary | ICD-10-CM | POA: Diagnosis not present

## 2018-03-14 DIAGNOSIS — I10 Essential (primary) hypertension: Secondary | ICD-10-CM | POA: Diagnosis not present

## 2018-03-14 LAB — POCT GLYCOSYLATED HEMOGLOBIN (HGB A1C): HbA1c, POC (controlled diabetic range): 6.6 % (ref 0.0–7.0)

## 2018-03-14 LAB — GLUCOSE, POCT (MANUAL RESULT ENTRY): POC Glucose: 90 mg/dl (ref 70–99)

## 2018-03-14 MED ORDER — INSULIN ASPART 100 UNIT/ML ~~LOC~~ SOLN: SUBCUTANEOUS | 6 refills | Status: DC

## 2018-03-14 MED ORDER — LISINOPRIL-HYDROCHLOROTHIAZIDE 20-12.5 MG PO TABS: 1.0000 | ORAL_TABLET | Freq: Every day | ORAL | 1 refills | Status: DC

## 2018-03-14 MED ORDER — INSULIN ASPART PROT & ASPART (70-30 MIX) 100 UNIT/ML PEN: 13.0000 [IU] | PEN_INJECTOR | Freq: Two times a day (BID) | SUBCUTANEOUS | 3 refills | Status: DC

## 2018-03-14 MED ORDER — FERROUS SULFATE 325 (65 FE) MG PO TABS: 325.0000 mg | ORAL_TABLET | Freq: Two times a day (BID) | ORAL | 6 refills | Status: DC

## 2018-03-14 NOTE — Progress Notes (Signed)
Subjective:  Patient ID: Morgan Moon, female    DOB: 27-Mar-1995  Age: 22 y.o. MRN: [id number]  CC: Diabetes   HPI KATHLEEN TAMM is a 22 year old female with a history of type 1 diabetes mellitus (A1c 6.6), hypertension, vitiligo, and anemia who presents today for a follow-up visit accompanied by her mother. A1c is 6.6 which has improved from 8.3 previously and she denies hypoglycemia, numbness in extremities or visual concerns.  Her fasting sugars have ranged between 70 and 100. She has been compliant with her antihypertensive and denies adverse effects from her antihypertensive. She had an ED visit for nausea 2 weeks ago and work-up was unrevealing.treated with IV fluids and subsequently discharged with antiemetic and she endorses resolution of symptoms. She has no additional concerns today. Past Medical History:  Diagnosis Date  . Diabetes mellitus without complication ([place])   . Hypertension     History reviewed. No pertinent surgical history.  Allergies  Allergen Reactions  . Amlodipine Other (See Comments)    headaches     Outpatient Medications Prior to Visit  Medication Sig Dispense Refill  . ACCU-CHEK SOFTCLIX LANCETS lancets USE AS INSTRUCTED 300 each 3  . B-D UF III MINI PEN NEEDLES 31G X 5 MM MISC USE AS DIRECTED TWICE DAILY FOR INSULING DOSING 100 each 2  . Blood Glucose Monitoring Suppl (ACCU-CHEK AVIVA PLUS) w/Device KIT 1 each by Does not apply route 3 (three) times daily. 1 kit 0  . glucose blood (ACCU-CHEK AVIVA PLUS) test strip USE AS DIRECTED. 300 each 3  . Insulin Syringe-Needle U-100 (BD INSULIN SYRINGE ULTRAFINE) 31G X 15/64" 0.5 ML MISC Use as directed twice daily. 300 each 3  . IRON PO Take by mouth.    . ondansetron (ZOFRAN ODT) 8 MG disintegrating tablet 1/2- 1 tablet q 8 hr prn nausea, vomiting 20 tablet 0  . ferrous sulfate 325 (65 FE) MG tablet Take 1 tablet (325 mg total) by mouth 2 (two) times daily with a meal. 60 tablet 3  . insulin aspart  (NOVOLOG) 100 UNIT/ML injection 0 to 12 units subcutaneously 3 times daily before meals as per sliding scale 3 vial 3  . insulin aspart protamine - aspart (NOVOLOG 70/30 MIX) (70-30) 100 UNIT/ML FlexPen Inject 0.15 mLs (15 Units total) into the skin 2 (two) times daily with a meal. 5 pen 3  . lisinopril-hydrochlorothiazide (ZESTORETIC) 20-12.5 MG tablet Take 1 tablet by mouth daily. 90 tablet 1   No facility-administered medications prior to visit.     ROS Review of Systems  Constitutional: Negative for activity change, appetite change and fatigue.  HENT: Negative for congestion, sinus pressure and sore throat.   Eyes: Negative for visual disturbance.  Respiratory: Negative for cough, chest tightness, shortness of breath and wheezing.   Cardiovascular: Negative for chest pain and palpitations.  Gastrointestinal: Negative for abdominal distention, abdominal pain and constipation.  Endocrine: Negative for polydipsia.  Genitourinary: Negative for dysuria and frequency.  Musculoskeletal: Negative for arthralgias and back pain.  Skin: Negative for rash.  Neurological: Negative for tremors, light-headedness and numbness.  Hematological: Does not bruise/bleed easily.  Psychiatric/Behavioral: Negative for agitation and behavioral problems.    Objective:  BP 125/70   Pulse (!) 110   Temp 98.4 F (36.9 C) (Oral)   Ht 5' 6" (1.676 m)   Wt 128 lb 9.6 oz (58.3 kg)   SpO2 99%   BMI 20.76 kg/m   BP/Weight 03/14/2018 30/09/6224 05/21/3543  Systolic BP 625 638 937  Diastolic BP 70 92 86  Wt. (Lbs) 128.6 - 129  BMI 20.76 - 20.82      Physical Exam Constitutional:      Appearance: She is well-developed.  Cardiovascular:     Rate and Rhythm: Tachycardia present.     Heart sounds: Normal heart sounds. No murmur.  Pulmonary:     Effort: Pulmonary effort is normal.     Breath sounds: Normal breath sounds. No wheezing or rales.  Chest:     Chest wall: No tenderness.  Abdominal:      General: Bowel sounds are normal. There is no distension.     Palpations: Abdomen is soft. There is no mass.     Tenderness: There is no abdominal tenderness.  Musculoskeletal: Normal range of motion.  Neurological:     Mental Status: She is alert and oriented to person, place, and time.  Psychiatric:        Mood and Affect: Mood normal.        Behavior: Behavior normal.     . CMP Latest Ref Rng & Units 02/28/2018 07/23/2017 03/10/2016  Glucose 70 - 99 mg/dL 85 271(H) 76  BUN 6 - 20 mg/dL _0 Creatinine 0.44 - 1.00 mg/dL 0.70 0.74 0.72  Sodium 135 - 145 mmol/L 137 139 139  Potassium 3.5 - 5.1 mmol/L 3.7 4.3 4.3  Chloride 98 - 111 mmol/L 100 99 106  CO2 20 - 29 mmol/L - 26 28  Calcium 8.7 - 10.2 mg/dL - 9.8 9.3  Total Protein 6.0 - 8.5 g/dL - 7.5 -  Total Bilirubin 0.0 - 1.2 mg/dL - <0.2 -  Alkaline Phos 39 - 117 IU/L - 108 -  AST 0 - 40 IU/L - 21 -  ALT 0 - 32 IU/L - 18 -    Lipid Panel     Component Value Date/Time   CHOL 149 03/24/2016 1120   TRIG 46 03/24/2016 1120   HDL 60 03/24/2016 1120   CHOLHDL 2.5 03/24/2016 1120   VLDL 9 03/24/2016 1120   LDLCALC 80 03/24/2016 1120    Lab Results  Component Value Date   HGBA1C 6.6 03/14/2018    Assessment & Plan:   1. Type 1 diabetes mellitus without complication (HCC) Controlled with A1c of 6.6 Reduce NovoLog 70/30 to 13 units twice daily to prevent hypoglycemia Counseled on Diabetic diet, my plate method, 161 minutes of moderate intensity exercise/week Keep blood sugar logs with fasting goals of 80-120 mg/dl, random of less than 180 and in the event of sugars less than 60 mg/dl or greater than 400 mg/dl please notify the clinic ASAP. It is recommended that you undergo annual eye exams and annual foot exams. Pneumonia vaccine is recommended. - POCT glucose (manual entry) - POCT glycosylated hemoglobin (Hb A1C) - Microalbumin/Creatinine Ratio, Urine - insulin aspart (NOVOLOG) 100 UNIT/ML injection; 0 to 12 units  subcutaneously 3 times daily before meals as per sliding scale  Dispense: 3 vial; Refill: 6 - insulin aspart protamine - aspart (NOVOLOG 70/30 MIX) (70-30) 100 UNIT/ML FlexPen; Inject 0.13 mLs (13 Units total) into the skin 2 (two) times daily with a meal.  Dispense: 5 pen; Refill: 3  2. Essential hypertension Controlled Counseled on Diabetic diet, my plate method, 096 minutes of moderate intensity exercise/week Keep blood sugar logs with fasting goals of 80-120 mg/dl, random of less than 180 and in the event of sugars less than 60 mg/dl or greater than 400 mg/dl please notify the clinic ASAP. It  is recommended that you undergo annual eye exams and annual foot exams. Pneumonia vaccine is recommended. - lisinopril-hydrochlorothiazide (ZESTORETIC) 20-12.5 MG tablet; Take 1 tablet by mouth daily.  Dispense: 90 tablet; Refill: 1  3. Vitiligo Stable She is not interested in dermatology referral at this time   Meds ordered this encounter  Medications  . ferrous sulfate 325 (65 FE) MG tablet    Sig: Take 1 tablet (325 mg total) by mouth 2 (two) times daily with a meal.    Dispense:  60 tablet    Refill:  6  . insulin aspart (NOVOLOG) 100 UNIT/ML injection    Sig: 0 to 12 units subcutaneously 3 times daily before meals as per sliding scale    Dispense:  3 vial    Refill:  6  . insulin aspart protamine - aspart (NOVOLOG 70/30 MIX) (70-30) 100 UNIT/ML FlexPen    Sig: Inject 0.13 mLs (13 Units total) into the skin 2 (two) times daily with a meal.    Dispense:  5 pen    Refill:  3    Patient requests 90 day supply: Discontinue previous dose  . lisinopril-hydrochlorothiazide (ZESTORETIC) 20-12.5 MG tablet    Sig: Take 1 tablet by mouth daily.    Dispense:  90 tablet    Refill:  1    Follow-up: Return in about 1 month (around 04/14/2018) for PAP smear.   Charlott Rakes MD

## 2018-03-15 LAB — MICROALBUMIN / CREATININE URINE RATIO
Creatinine, Urine: 84 mg/dL
Microalb/Creat Ratio: 16.3 mg/g creat (ref 0.0–30.0)
Microalbumin, Urine: 13.7 ug/mL

## 2018-03-26 ENCOUNTER — Other Ambulatory Visit: Payer: Self-pay | Admitting: Family Medicine

## 2018-04-16 ENCOUNTER — Encounter: Payer: Self-pay | Admitting: Family Medicine

## 2018-04-16 ENCOUNTER — Ambulatory Visit: Payer: Medicaid Other | Attending: Family Medicine | Admitting: Family Medicine

## 2018-04-16 ENCOUNTER — Other Ambulatory Visit (HOSPITAL_COMMUNITY)
Admission: RE | Admit: 2018-04-16 | Discharge: 2018-04-16 | Disposition: A | Discharge status: Home / Self Care | Payer: Medicaid Other | Source: Ambulatory Visit | Attending: Family Medicine | Admitting: Family Medicine

## 2018-04-16 VITALS — BP 125/83 | HR 81 | Temp 98.7°F | Ht 66.0 in | Wt 128.2 lb

## 2018-04-16 DIAGNOSIS — Z794 Long term (current) use of insulin: Secondary | ICD-10-CM | POA: Diagnosis not present

## 2018-04-16 DIAGNOSIS — I10 Essential (primary) hypertension: Secondary | ICD-10-CM | POA: Diagnosis not present

## 2018-04-16 DIAGNOSIS — E109 Type 1 diabetes mellitus without complications: Secondary | ICD-10-CM | POA: Diagnosis present

## 2018-04-16 DIAGNOSIS — Z124 Encounter for screening for malignant neoplasm of cervix: Secondary | ICD-10-CM | POA: Insufficient documentation

## 2018-04-16 DIAGNOSIS — Z79899 Other long term (current) drug therapy: Secondary | ICD-10-CM | POA: Insufficient documentation

## 2018-04-16 DIAGNOSIS — L80 Vitiligo: Secondary | ICD-10-CM | POA: Diagnosis not present

## 2018-04-16 DIAGNOSIS — Z888 Allergy status to other drugs, medicaments and biological substances status: Secondary | ICD-10-CM | POA: Insufficient documentation

## 2018-04-16 DIAGNOSIS — E1069 Type 1 diabetes mellitus with other specified complication: Secondary | ICD-10-CM | POA: Diagnosis not present

## 2018-04-16 DIAGNOSIS — B3731 Acute candidiasis of vulva and vagina: Secondary | ICD-10-CM

## 2018-04-16 DIAGNOSIS — B373 Candidiasis of vulva and vagina: Secondary | ICD-10-CM | POA: Diagnosis not present

## 2018-04-16 LAB — GLUCOSE, POCT (MANUAL RESULT ENTRY): POC Glucose: 98 mg/dl (ref 70–99)

## 2018-04-16 MED ORDER — FLUCONAZOLE 150 MG PO TABS: 150.0000 mg | ORAL_TABLET | Freq: Once | ORAL | 0 refills | Status: AC

## 2018-04-16 NOTE — Progress Notes (Signed)
Subjective:  Patient ID: Morgan Moon, female    DOB: 1995/05/03  Age: 23 y.o. MRN: [id number]  CC: Gynecologic Exam   HPI Morgan Moon is a 23 year old female with a history of type 1 diabetes mellitus (A1c 6.6), hypertension, vitiligo, here for a PAP smear. She has never had a PAP smear and has no acute concerns today.  Past Medical History:  Diagnosis Date  . Diabetes mellitus without complication ([place])   . Hypertension     History reviewed. No pertinent surgical history.  Allergies  Allergen Reactions  . Amlodipine Other (See Comments)    headaches    Outpatient Medications Prior to Visit  Medication Sig Dispense Refill  . ACCU-CHEK SOFTCLIX LANCETS lancets USE AS INSTRUCTED 300 each 3  . B-D UF III MINI PEN NEEDLES 31G X 5 MM MISC USE AS DIRECTED TWICE DAILY FOR INSULING DOSING 100 each 2  . Blood Glucose Monitoring Suppl (ACCU-CHEK AVIVA PLUS) w/Device KIT 1 each by Does not apply route 3 (three) times daily. 1 kit 0  . glucose blood (ACCU-CHEK AVIVA PLUS) test strip USE AS DIRECTED. 300 each 3  . insulin aspart (NOVOLOG) 100 UNIT/ML injection 0 to 12 units subcutaneously 3 times daily before meals as per sliding scale 3 vial 6  . Insulin Syringe-Needle U-100 (BD INSULIN SYRINGE ULTRAFINE) 31G X 15/64" 0.5 ML MISC Use as directed twice daily. 300 each 3  . IRON PO Take by mouth.    Marland Kitchen lisinopril-hydrochlorothiazide (ZESTORETIC) 20-12.5 MG tablet Take 1 tablet by mouth daily. 90 tablet 1  . ondansetron (ZOFRAN ODT) 8 MG disintegrating tablet 1/2- 1 tablet q 8 hr prn nausea, vomiting 20 tablet 0  . ferrous sulfate 325 (65 FE) MG tablet Take 1 tablet (325 mg total) by mouth 2 (two) times daily with a meal. (Patient not taking: Reported on 04/16/2018) 60 tablet 6  . insulin aspart protamine - aspart (NOVOLOG 70/30 MIX) (70-30) 100 UNIT/ML FlexPen Inject 0.13 mLs (13 Units total) into the skin 2 (two) times daily with a meal. (Patient not taking: Reported on 04/16/2018) 5 pen  3   No facility-administered medications prior to visit.     ROS Review of Systems  Constitutional: Negative for activity change, appetite change and fatigue.  HENT: Negative for congestion, sinus pressure and sore throat.   Eyes: Negative for visual disturbance.  Respiratory: Negative for cough, chest tightness, shortness of breath and wheezing.   Cardiovascular: Negative for chest pain and palpitations.  Gastrointestinal: Negative for abdominal distention, abdominal pain and constipation.  Endocrine: Negative for polydipsia.  Genitourinary: Negative for dysuria and frequency.  Musculoskeletal: Negative for arthralgias and back pain.  Skin: Negative for rash.  Neurological: Negative for tremors, light-headedness and numbness.  Hematological: Does not bruise/bleed easily.  Psychiatric/Behavioral: Negative for agitation and behavioral problems.    Objective:  BP 125/83   Pulse 81   Temp 98.7 F (37.1 C) (Oral)   Ht '5\' 6"'$  (1.676 m)   Wt 128 lb 3.2 oz (58.2 kg)   SpO2 96%   BMI 20.69 kg/m   BP/Weight 04/16/2018 03/14/2018 76/72/0947  Systolic BP 096 283 662  Diastolic BP 83 70 92  Wt. (Lbs) 128.2 128.6 -  BMI 20.69 20.76 -      Physical Exam Constitutional:      Appearance: She is well-developed.  Cardiovascular:     Rate and Rhythm: Normal rate.     Heart sounds: Normal heart sounds. No murmur.  Pulmonary:  Effort: Pulmonary effort is normal.     Breath sounds: Normal breath sounds. No wheezing or rales.  Chest:     Chest wall: No tenderness.  Abdominal:     General: Bowel sounds are normal. There is no distension.     Palpations: Abdomen is soft. There is no mass.     Tenderness: There is no abdominal tenderness.  Genitourinary:    Comments: External genitalia-hypopigmentation in keeping with vitiligo Vagina-thick whitish discharge Cervix-normal, no CMT, normal adnexa Musculoskeletal: Normal range of motion.  Neurological:     Mental Status: She is  alert and oriented to person, place, and time.  Psychiatric:        Mood and Affect: Mood normal.        Behavior: Behavior normal.     Lab Results  Component Value Date   HGBA1C 6.6 03/14/2018    Assessment & Plan:   1. Type 1 Diabetes Mellitus with other specific complications Controlled with A1c of 6.6 - POCT glucose (manual entry)  2. Screening for cervical cancer - Cytology - PAP([place])  3. Vaginal candidiasis - fluconazole (DIFLUCAN) 150 MG tablet; Take 1 tablet (150 mg total) by mouth once for 1 dose.  Dispense: 1 tablet; Refill: 0   Meds ordered this encounter  Medications  . fluconazole (DIFLUCAN) 150 MG tablet    Sig: Take 1 tablet (150 mg total) by mouth once for 1 dose.    Dispense:  1 tablet    Refill:  0    Follow-up: Return in about 3 months (around 07/16/2018) for follow up of her chronic medical conditions.   Charlott Rakes MD

## 2018-04-18 LAB — CYTOLOGY - PAP
Candida vaginitis: NEGATIVE
Chlamydia: NEGATIVE
Diagnosis: NEGATIVE
Neisseria Gonorrhea: NEGATIVE
Trichomonas: NEGATIVE

## 2018-04-19 ENCOUNTER — Other Ambulatory Visit: Payer: Self-pay | Admitting: Family Medicine

## 2018-04-19 DIAGNOSIS — I10 Essential (primary) hypertension: Secondary | ICD-10-CM

## 2018-04-22 ENCOUNTER — Telehealth: Payer: Self-pay

## 2018-04-22 ENCOUNTER — Other Ambulatory Visit: Payer: Self-pay | Admitting: Pharmacist

## 2018-04-22 DIAGNOSIS — E109 Type 1 diabetes mellitus without complications: Secondary | ICD-10-CM

## 2018-04-22 LAB — CERVICOVAGINAL ANCILLARY ONLY: Herpes: NEGATIVE

## 2018-04-22 MED ORDER — ACCU-CHEK SOFTCLIX LANCETS MISC: 11 refills | Status: DC

## 2018-04-22 NOTE — Telephone Encounter (Signed)
Patient was called and voicemail is not set up to leave a message. 

## 2018-04-22 NOTE — Telephone Encounter (Signed)
-----   Message from Enobong Newlin, MD sent at 04/19/2018  2:19 PM EST ----- Please inform the patient that labs are normal. Thank you. 

## 2018-04-26 ENCOUNTER — Telehealth: Payer: Self-pay

## 2018-04-26 NOTE — Telephone Encounter (Signed)
-----   Message from Enobong Newlin, MD sent at 04/19/2018  2:19 PM EST ----- Please inform the patient that labs are normal. Thank you. 

## 2018-04-26 NOTE — Telephone Encounter (Signed)
Patient was called and there is no voicemail set up to leave a message. Patient has mychart.

## 2018-06-26 ENCOUNTER — Other Ambulatory Visit: Payer: Self-pay | Admitting: Family Medicine

## 2018-06-26 ENCOUNTER — Other Ambulatory Visit: Payer: Self-pay | Admitting: Pharmacist

## 2018-06-26 DIAGNOSIS — E109 Type 1 diabetes mellitus without complications: Secondary | ICD-10-CM

## 2018-06-26 MED ORDER — GLUCOSE BLOOD VI STRP: ORAL_STRIP | 2 refills | Status: DC

## 2018-06-27 ENCOUNTER — Other Ambulatory Visit: Payer: Self-pay | Admitting: Pharmacist

## 2018-06-27 MED ORDER — GLUCOSE BLOOD VI STRP: ORAL_STRIP | 12 refills | Status: DC

## 2018-06-27 MED ORDER — ACCU-CHEK GUIDE ME W/DEVICE KIT: 1.0000 | PACK | Freq: Every day | 0 refills | Status: DC

## 2018-06-27 MED ORDER — ACCU-CHEK FASTCLIX LANCETS MISC: 11 refills | Status: DC

## 2018-07-17 ENCOUNTER — Ambulatory Visit: Payer: Medicaid Other | Admitting: Family Medicine

## 2018-08-14 NOTE — Telephone Encounter (Signed)
error 

## 2018-08-15 ENCOUNTER — Encounter: Payer: Self-pay | Admitting: Family Medicine

## 2018-08-17 ENCOUNTER — Encounter: Payer: Self-pay | Admitting: Family Medicine

## 2018-08-19 NOTE — Telephone Encounter (Signed)
Please refill test strips for patient if not already completed.

## 2018-08-26 ENCOUNTER — Encounter: Payer: Self-pay | Admitting: Family Medicine

## 2018-08-26 ENCOUNTER — Other Ambulatory Visit: Payer: Self-pay

## 2018-08-26 ENCOUNTER — Ambulatory Visit: Payer: Medicaid Other | Attending: Family Medicine | Admitting: Family Medicine

## 2018-08-26 DIAGNOSIS — I10 Essential (primary) hypertension: Secondary | ICD-10-CM

## 2018-08-26 DIAGNOSIS — E109 Type 1 diabetes mellitus without complications: Secondary | ICD-10-CM

## 2018-08-26 MED ORDER — INSULIN ASPART PROT & ASPART (70-30 MIX) 100 UNIT/ML PEN: PEN_INJECTOR | SUBCUTANEOUS | 6 refills | Status: DC

## 2018-08-26 MED ORDER — LISINOPRIL-HYDROCHLOROTHIAZIDE 20-12.5 MG PO TABS: 1.0000 | ORAL_TABLET | Freq: Every day | ORAL | 1 refills | Status: DC

## 2018-08-26 NOTE — Progress Notes (Signed)
Virtual Visit via Video Note  I connected with Morgan Moon, on 08/26/2018 at 1:48 PM by video enabled telemedicine device due to the COVID-19 pandemic and verified that I am speaking with the correct person using two identifiers.   Consent: I discussed the limitations, risks, security and privacy concerns of performing an evaluation and management service by telemedicine and the availability of in person appointments. I also discussed with the patient that there may be a patient responsible charge related to this service. The patient expressed understanding and agreed to proceed.   Location of Patient: Patient's home  Location of Provider: Clinic   Persons participating in Telemedicine visit: Diala K Jaia Alonge Farrington-CMA Dr. Felecia Shelling     History of Present Illness: Morgan Moon is a 23 year old female with a history of type 1 diabetes mellitus (A1c 6.6), hypertension, vitiligo, and anemia who presents today for a follow-up visit. Her fasting sugars have been between 80 and 100 and she denies hypoglycemic episodes, numbness in extremities or visual concerns. She is not up-to-date on her annual eye exam as she missed her last appointment scheduled for 2 months ago due to the ongoing COVID-19 pandemic. I had received a form for prior authorization from her insurance company due to her request for NovoLog flex pens which she states is easier with regards to administration.  In the past to use the vial but her mother had to help her withdraw the insulin. She denies additional concerns at this time.   Doing well on her antihypertensive and denies adverse effects.  Past Medical History:  Diagnosis Date  . Diabetes mellitus without complication ([place])   . Hypertension    Allergies  Allergen Reactions  . Amlodipine Other (See Comments)    headaches    Current Outpatient Medications on File Prior to Visit  Medication Sig Dispense Refill  . glucose blood (ACCU-CHEK  GUIDE) test strip Use as instructed to check blood sugar up to 3 times daily. 100 each 12  . Insulin Syringe-Needle U-100 (BD INSULIN SYRINGE ULTRAFINE) 31G X 15/64" 0.5 ML MISC Use as directed twice daily. 300 each 3  . lisinopril-hydrochlorothiazide (ZESTORETIC) 20-12.5 MG tablet Take 1 tablet by mouth daily. 90 tablet 1  . NOVOLOG MIX 70/30 FLEXPEN (70-30) 100 UNIT/ML FlexPen INJECT 15 UNITS UNDER THE SKIN TWICE DAILY WITH A MEAL 15 mL 2  . ondansetron (ZOFRAN ODT) 8 MG disintegrating tablet 1/2- 1 tablet q 8 hr prn nausea, vomiting 20 tablet 0  . Accu-Chek FastClix Lancets MISC Use as instructed to check blood sugar up to 3 times daily. 102 each 11  . B-D UF III MINI PEN NEEDLES 31G X 5 MM MISC USE AS DIRECTED TWICE DAILY FOR INSULING DOSING 100 each 2  . Blood Glucose Monitoring Suppl (ACCU-CHEK GUIDE ME) w/Device KIT 1 kit by Does not apply route daily. 1 kit 0  . ferrous sulfate 325 (65 FE) MG tablet Take 1 tablet (325 mg total) by mouth 2 (two) times daily with a meal. (Patient not taking: Reported on 04/16/2018) 60 tablet 6  . insulin aspart (NOVOLOG) 100 UNIT/ML injection 0 to 12 units subcutaneously 3 times daily before meals as per sliding scale (Patient not taking: Reported on 08/26/2018) 3 vial 6  . IRON PO Take by mouth.     No current facility-administered medications on file prior to visit.     Observations/Objective: Weak, alert, oriented x3 Not in acute distress   Lab Results  Component Value Date  HGBA1C 6.6 03/14/2018    CMP Latest Ref Rng & Units 02/28/2018 07/23/2017 03/10/2016  Glucose 70 - 99 mg/dL 85 271(H) 76  BUN 6 - 20 mg/dL '14 10 8  '$ Creatinine 0.44 - 1.00 mg/dL 0.70 0.74 0.72  Sodium 135 - 145 mmol/L 137 139 139  Potassium 3.5 - 5.1 mmol/L 3.7 4.3 4.3  Chloride 98 - 111 mmol/L 100 99 106  CO2 20 - 29 mmol/L - 26 28  Calcium 8.7 - 10.2 mg/dL - 9.8 9.3  Total Protein 6.0 - 8.5 g/dL - 7.5 -  Total Bilirubin 0.0 - 1.2 mg/dL - <0.2 -  Alkaline Phos 39 - 117  IU/L - 108 -  AST 0 - 40 IU/L - 21 -  ALT 0 - 32 IU/L - 18 -    Assessment and Plan: 1. Type 1 diabetes mellitus without complication ([facility]) Controlled with A1c of 6.6 No hypoglycemic episode Encouraged to reschedule her Ophthalmology appointment Counseled on Diabetic diet, my plate method, 282 minutes of moderate intensity exercise/week Keep blood sugar logs with fasting goals of 80-120 mg/dl, random of less than 180 and in the event of sugars less than 60 mg/dl or greater than 400 mg/dl please notify the clinic ASAP. It is recommended that you undergo annual eye exams and annual foot exams. Pneumonia vaccine is recommended. - insulin aspart protamine - aspart (NOVOLOG MIX 70/30 FLEXPEN) (70-30) 100 UNIT/ML FlexPen; INJECT 15 UNITS UNDER THE SKIN TWICE DAILY WITH A MEAL  Dispense: 15 mL; Refill: 6  2. Essential hypertension Stable Counseled on blood pressure goal of less than 130/80, low-sodium, DASH diet, medication compliance, 150 minutes of moderate intensity exercise per week. Discussed medication compliance, adverse effects. - lisinopril-hydrochlorothiazide (ZESTORETIC) 20-12.5 MG tablet; Take 1 tablet by mouth daily.  Dispense: 90 tablet; Refill: 1   Follow Up Instructions: Return in about 3 months (around 11/26/2018) for medical conditions.    I discussed the assessment and treatment plan with the patient. The patient was provided an opportunity to ask questions and all were answered. The patient agreed with the plan and demonstrated an understanding of the instructions.   The patient was advised to call back or seek an in-person evaluation if the symptoms worsen or if the condition fails to improve as anticipated.     I provided 12 minutes total of Telehealth time during this encounter including median intraservice time, reviewing previous notes, labs, imaging, medications and explaining diagnosis and management.     Charlott Rakes, MD, FAAFP. [facility] [place], [place]   08/26/2018, 1:48 PM

## 2018-08-26 NOTE — Patient Instructions (Signed)
Diabetes Mellitus and Nutrition, Adult  When you have diabetes (diabetes mellitus), it is very important to have healthy eating habits because your blood sugar (glucose) levels are greatly affected by what you eat and drink. Eating healthy foods in the appropriate amounts, at about the same times every day, can help you:  · Control your blood glucose.  · Lower your risk of heart disease.  · Improve your blood pressure.  · Reach or maintain a healthy weight.  Every person with diabetes is different, and each person has different needs for a meal plan. Your health care provider may recommend that you work with a diet and nutrition specialist (dietitian) to make a meal plan that is best for you. Your meal plan may vary depending on factors such as:  · The calories you need.  · The medicines you take.  · Your weight.  · Your blood glucose, blood pressure, and cholesterol levels.  · Your activity level.  · Other health conditions you have, such as heart or kidney disease.  How do carbohydrates affect me?  Carbohydrates, also called carbs, affect your blood glucose level more than any other type of food. Eating carbs naturally raises the amount of glucose in your blood. Carb counting is a method for keeping track of how many carbs you eat. Counting carbs is important to keep your blood glucose at a healthy level, especially if you use insulin or take certain oral diabetes medicines.  It is important to know how many carbs you can safely have in each meal. This is different for every person. Your dietitian can help you calculate how many carbs you should have at each meal and for each snack.  Foods that contain carbs include:  · Bread, cereal, rice, pasta, and crackers.  · Potatoes and corn.  · Peas, beans, and lentils.  · Milk and yogurt.  · Fruit and juice.  · Desserts, such as cakes, cookies, ice cream, and candy.  How does alcohol affect me?  Alcohol can cause a sudden decrease in blood glucose (hypoglycemia),  especially if you use insulin or take certain oral diabetes medicines. Hypoglycemia can be a life-threatening condition. Symptoms of hypoglycemia (sleepiness, dizziness, and confusion) are similar to symptoms of having too much alcohol.  If your health care provider says that alcohol is safe for you, follow these guidelines:  · Limit alcohol intake to no more than 1 drink per day for nonpregnant women and 2 drinks per day for men. One drink equals 12 oz of beer, 5 oz of wine, or 1½ oz of hard liquor.  · Do not drink on an empty stomach.  · Keep yourself hydrated with water, diet soda, or unsweetened iced tea.  · Keep in mind that regular soda, juice, and other mixers may contain a lot of sugar and must be counted as carbs.  What are tips for following this plan?    Reading food labels  · Start by checking the serving size on the "Nutrition Facts" label of packaged foods and drinks. The amount of calories, carbs, fats, and other nutrients listed on the label is based on one serving of the item. Many items contain more than one serving per package.  · Check the total grams (g) of carbs in one serving. You can calculate the number of servings of carbs in one serving by dividing the total carbs by 15. For example, if a food has 30 g of total carbs, it would be equal to 2   servings of carbs.  · Check the number of grams (g) of saturated and trans fats in one serving. Choose foods that have low or no amount of these fats.  · Check the number of milligrams (mg) of salt (sodium) in one serving. Most people should limit total sodium intake to less than 2,300 mg per day.  · Always check the nutrition information of foods labeled as "low-fat" or "nonfat". These foods may be higher in added sugar or refined carbs and should be avoided.  · Talk to your dietitian to identify your daily goals for nutrients listed on the label.  Shopping  · Avoid buying canned, premade, or processed foods. These foods tend to be high in fat, sodium,  and added sugar.  · Shop around the outside edge of the grocery store. This includes fresh fruits and vegetables, bulk grains, fresh meats, and fresh dairy.  Cooking  · Use low-heat cooking methods, such as baking, instead of high-heat cooking methods like deep frying.  · Cook using healthy oils, such as olive, canola, or sunflower oil.  · Avoid cooking with butter, cream, or high-fat meats.  Meal planning  · Eat meals and snacks regularly, preferably at the same times every day. Avoid going long periods of time without eating.  · Eat foods high in fiber, such as fresh fruits, vegetables, beans, and whole grains. Talk to your dietitian about how many servings of carbs you can eat at each meal.  · Eat 4-6 ounces (oz) of lean protein each day, such as lean meat, chicken, fish, eggs, or tofu. One oz of lean protein is equal to:  ? 1 oz of meat, chicken, or fish.  ? 1 egg.  ? ¼ cup of tofu.  · Eat some foods each day that contain healthy fats, such as avocado, nuts, seeds, and fish.  Lifestyle  · Check your blood glucose regularly.  · Exercise regularly as told by your health care provider. This may include:  ? 150 minutes of moderate-intensity or vigorous-intensity exercise each week. This could be brisk walking, biking, or water aerobics.  ? Stretching and doing strength exercises, such as yoga or weightlifting, at least 2 times a week.  · Take medicines as told by your health care provider.  · Do not use any products that contain nicotine or tobacco, such as cigarettes and e-cigarettes. If you need help quitting, ask your health care provider.  · Work with a counselor or diabetes educator to identify strategies to manage stress and any emotional and social challenges.  Questions to ask a health care provider  · Do I need to meet with a diabetes educator?  · Do I need to meet with a dietitian?  · What number can I call if I have questions?  · When are the best times to check my blood glucose?  Where to find more  information:  · American Diabetes Association: diabetes.org  · Academy of Nutrition and Dietetics: www.eatright.org  · National Institute of Diabetes and Digestive and Kidney Diseases (NIH): www.niddk.nih.gov  Summary  · A healthy meal plan will help you control your blood glucose and maintain a healthy lifestyle.  · Working with a diet and nutrition specialist (dietitian) can help you make a meal plan that is best for you.  · Keep in mind that carbohydrates (carbs) and alcohol have immediate effects on your blood glucose levels. It is important to count carbs and to use alcohol carefully.  This information is not intended to   replace advice given to you by your health care provider. Make sure you discuss any questions you have with your health care provider.  Document Released: 12/01/2004 Document Revised: 10/04/2016 Document Reviewed: 04/10/2016  Elsevier Interactive Patient Education © 2019 [employer]

## 2018-09-27 ENCOUNTER — Encounter: Payer: Self-pay | Admitting: Family Medicine

## 2018-09-27 NOTE — Telephone Encounter (Signed)
Please refill if appropriate

## 2018-09-27 NOTE — Telephone Encounter (Signed)
Please address

## 2018-09-30 ENCOUNTER — Other Ambulatory Visit: Payer: Self-pay | Admitting: Family Medicine

## 2018-10-01 MED ORDER — FERROUS SULFATE 325 (65 FE) MG PO TABS: 325.0000 mg | ORAL_TABLET | Freq: Two times a day (BID) | ORAL | 2 refills | Status: DC

## 2019-01-11 ENCOUNTER — Encounter: Payer: Self-pay | Admitting: Family Medicine

## 2019-01-13 ENCOUNTER — Other Ambulatory Visit: Payer: Self-pay | Admitting: Family Medicine

## 2019-01-13 ENCOUNTER — Encounter: Payer: Self-pay | Admitting: Family Medicine

## 2019-01-13 MED ORDER — ONDANSETRON 8 MG PO TBDP: ORAL_TABLET | ORAL | 0 refills | Status: DC

## 2019-01-13 MED ORDER — AMOXICILLIN 500 MG PO CAPS: 500.0000 mg | ORAL_CAPSULE | Freq: Three times a day (TID) | ORAL | 0 refills | Status: DC

## 2019-03-11 DIAGNOSIS — E109 Type 1 diabetes mellitus without complications: Secondary | ICD-10-CM | POA: Diagnosis not present

## 2019-03-11 DIAGNOSIS — H04123 Dry eye syndrome of bilateral lacrimal glands: Secondary | ICD-10-CM | POA: Diagnosis not present

## 2019-03-11 LAB — HM DIABETES EYE EXAM

## 2019-04-22 ENCOUNTER — Encounter: Payer: Self-pay | Admitting: Family Medicine

## 2019-04-23 ENCOUNTER — Other Ambulatory Visit: Payer: Self-pay | Admitting: Family Medicine

## 2019-04-23 MED ORDER — LIDOCAINE VISCOUS HCL 2 % MT SOLN: 5.0000 mL | Freq: Four times a day (QID) | OROMUCOSAL | 0 refills | Status: DC | PRN

## 2019-05-10 ENCOUNTER — Encounter: Payer: Self-pay | Admitting: Family Medicine

## 2019-05-11 ENCOUNTER — Encounter (HOSPITAL_COMMUNITY): Payer: Self-pay

## 2019-05-11 ENCOUNTER — Ambulatory Visit (HOSPITAL_COMMUNITY)
Admission: EM | Admit: 2019-05-11 | Discharge: 2019-05-11 | Disposition: A | Discharge status: Home / Self Care | Payer: Medicaid Other | Attending: Family Medicine | Admitting: Family Medicine

## 2019-05-11 ENCOUNTER — Other Ambulatory Visit: Payer: Self-pay

## 2019-05-11 DIAGNOSIS — K0889 Other specified disorders of teeth and supporting structures: Secondary | ICD-10-CM | POA: Diagnosis not present

## 2019-05-11 DIAGNOSIS — K047 Periapical abscess without sinus: Secondary | ICD-10-CM

## 2019-05-11 DIAGNOSIS — K029 Dental caries, unspecified: Secondary | ICD-10-CM

## 2019-05-11 MED ORDER — AMOXICILLIN 875 MG PO TABS: 875.0000 mg | ORAL_TABLET | Freq: Two times a day (BID) | ORAL | 0 refills | Status: AC

## 2019-05-11 NOTE — Discharge Instructions (Addendum)
You likely have a dental abscess. I have sent in antibiotics for you to take. One pill in the morning and one pill in the evening.   Follow up with a dentist as needed.

## 2019-05-11 NOTE — ED Triage Notes (Signed)
Pt c/o dental pain to upper and lower right side since yesterday. States "it's my wisdom teeth". Pt states WT have not been evaluated by DDS. Denies fever, chills.   Needs refill for lisinopril-HCTZ.

## 2019-05-11 NOTE — ED Provider Notes (Signed)
[place]    CSN: [id number] Arrival date & time: 05/11/19  1012      History   Chief Complaint Chief Complaint  Patient presents with  . Dental Pain    HPI Morgan Moon is a 24 y.o. female.   Patient reports dental pain the right lower jaw x3 days.  Reports right-sided facial swelling just today.  Reports that she does not have a dentist.  Denies headache, fever, shortness of breath, chills, nausea, vomiting, diarrhea, abdominal pain, rash, other symptoms.  ROS per HPI  The history is provided by the patient.  Dental Pain   Past Medical History:  Diagnosis Date  . Diabetes mellitus without complication ([place])   . Hypertension     Patient Active Problem List   Diagnosis Date Noted  . Anemia 07/23/2017  . Family history of breast cancer 07/23/2017  . Hypertension 07/23/2017  . DM (diabetes mellitus), type 1, uncontrolled ([place]) 06/30/2015  . Vitiligo 06/30/2015  . Systolic murmur 53/29/9242    History reviewed. No pertinent surgical history.  OB History   No obstetric history on file.      Home Medications    Prior to Admission medications   Medication Sig Start Date End Date Taking? Authorizing Provider  insulin aspart (NOVOLOG) 100 UNIT/ML injection 0 to 12 units subcutaneously 3 times daily before meals as per sliding scale 03/14/18  Yes Newlin, Enobong, MD  lisinopril-hydrochlorothiazide (ZESTORETIC) 20-12.5 MG tablet Take 1 tablet by mouth daily. 08/26/18  Yes Charlott Rakes, MD  Accu-Chek FastClix Lancets MISC Use as instructed to check blood sugar up to 3 times daily. 06/27/18   Charlott Rakes, MD  amoxicillin (AMOXIL) 875 MG tablet Take 1 tablet (875 mg total) by mouth 2 (two) times daily for 10 days. 05/11/19 05/21/19  Faustino Congress, NP  B-D UF III MINI PEN NEEDLES 31G X 5 MM MISC USE AS DIRECTED TWICE DAILY FOR INSULIN DOSING 09/30/18   Charlott Rakes, MD  Blood Glucose Monitoring Suppl (ACCU-CHEK GUIDE ME) w/Device KIT 1 kit by Does  not apply route daily. 06/27/18   Charlott Rakes, MD  ferrous sulfate 325 (65 FE) MG tablet Take 1 tablet (325 mg total) by mouth 2 (two) times daily with a meal. 10/01/18   Newlin, Enobong, MD  glucose blood (ACCU-CHEK GUIDE) test strip Use as instructed to check blood sugar up to 3 times daily. 06/27/18   Charlott Rakes, MD  insulin aspart protamine - aspart (NOVOLOG MIX 70/30 FLEXPEN) (70-30) 100 UNIT/ML FlexPen INJECT 15 UNITS UNDER THE SKIN TWICE DAILY WITH A MEAL 08/26/18   Charlott Rakes, MD  Insulin Syringe-Needle U-100 (BD INSULIN SYRINGE ULTRAFINE) 31G X 15/64" 0.5 ML MISC Use as directed twice daily. 12/04/16   Alfonse Spruce, FNP  lidocaine (XYLOCAINE) 2 % solution Use as directed 5 mLs in the mouth or throat every 6 (six) hours as needed for mouth pain. And spit out after 04/23/19   Charlott Rakes, MD  ondansetron (ZOFRAN ODT) 8 MG disintegrating tablet 1/2- 1 tablet q 8 hr prn nausea, vomiting 01/13/19   Charlott Rakes, MD    Family History Family History  Problem Relation Age of Onset  . Hypertension Maternal Aunt   . Hypertension Maternal Uncle   . Hypertension Maternal Grandmother   . Hypertension Maternal Grandfather     Social History Social History   Tobacco Use  . Smoking status: Never Smoker  . Smokeless tobacco: Never Used  Substance Use Topics  . Alcohol use:  No  . Drug use: No     Allergies   Amlodipine   Review of Systems Review of Systems   Physical Exam Triage Vital Signs ED Triage Vitals  Enc Vitals Group     BP 05/11/19 1022 128/78     Pulse Rate 05/11/19 1022 100     Resp 05/11/19 1022 18     Temp 05/11/19 1022 98.5 F (36.9 C)     Temp Source 05/11/19 1022 Oral     SpO2 05/11/19 1022 97 %     Weight --      Height --      Head Circumference --      Peak Flow --      Pain Score 05/11/19 1019 9     Pain Loc --      Pain Edu? --      Excl. in [place]? --    No data found.  Updated Vital Signs BP 128/78 (BP Location: Right Arm)    Pulse 100   Temp 98.5 F (36.9 C) (Oral)   Resp 18   LMP 04/27/2019 (Approximate)   SpO2 97%   Visual Acuity Right Eye Distance:   Left Eye Distance:   Bilateral Distance:    Right Eye Near:   Left Eye Near:    Bilateral Near:     Physical Exam Vitals and nursing note reviewed.  Constitutional:      General: She is not in acute distress.    Appearance: She is well-developed.  HENT:     Head: Normocephalic and atraumatic.     Right Ear: Tympanic membrane normal.     Left Ear: Tympanic membrane normal.     Nose: Nose normal.     Mouth/Throat:     Mouth: Mucous membranes are moist.     Dentition: Gingival swelling, dental caries and dental abscesses present.      Comments: Areas highlighted have multiple caries, discoloration. Eyes:     Conjunctiva/sclera: Conjunctivae normal.  Cardiovascular:     Rate and Rhythm: Normal rate and regular rhythm.     Heart sounds: Normal heart sounds. No murmur.  Pulmonary:     Effort: Pulmonary effort is normal. No respiratory distress.     Breath sounds: Normal breath sounds.  Abdominal:     General: Bowel sounds are normal.     Palpations: Abdomen is soft.     Tenderness: There is no abdominal tenderness.  Musculoskeletal:     Cervical back: Neck supple.  Skin:    General: Skin is warm and dry.  Neurological:     Mental Status: She is alert.  Psychiatric:        Mood and Affect: Mood normal.        Behavior: Behavior normal.      UC Treatments / Results  Labs (all labs ordered are listed, but only abnormal results are displayed) Labs Reviewed - No data to display  EKG   Radiology No results found.  Procedures Procedures (including critical care time)  Medications Ordered in UC Medications - No data to display  Initial Impression / Assessment and Plan / UC Course  I have reviewed the triage vital signs and the nursing notes.  Pertinent labs & imaging results that were available during my care of the patient  were reviewed by me and considered in my medical decision making (see chart for details).     Most likely dental abscess.  Amoxicillin 875 twice daily x10 days.  Instructed that  she can take ibuprofen or Tylenol as needed for pain.  Instructed to follow-up with dentist.  Instructed on when to go to the ER. Final Clinical Impressions(s) / UC Diagnoses   Final diagnoses:  Dental caries  Pain, dental  Dental abscess     Discharge Instructions     You likely have a dental abscess. I have sent in antibiotics for you to take. One pill in the morning and one pill in the evening.   Follow up with a dentist as needed.     ED Prescriptions    Medication Sig Dispense Auth. Provider   amoxicillin (AMOXIL) 875 MG tablet Take 1 tablet (875 mg total) by mouth 2 (two) times daily for 10 days. 20 tablet Faustino Congress, NP     I have reviewed the PDMP during this encounter.   Faustino Congress, NP 05/11/19 1044

## 2019-05-12 ENCOUNTER — Other Ambulatory Visit: Payer: Self-pay | Admitting: Family Medicine

## 2019-05-12 DIAGNOSIS — I10 Essential (primary) hypertension: Secondary | ICD-10-CM

## 2019-05-12 MED ORDER — LISINOPRIL-HYDROCHLOROTHIAZIDE 20-12.5 MG PO TABS: 1.0000 | ORAL_TABLET | Freq: Every day | ORAL | 1 refills | Status: DC

## 2019-05-18 ENCOUNTER — Encounter: Payer: Self-pay | Admitting: Family Medicine

## 2019-05-21 ENCOUNTER — Ambulatory Visit: Payer: Medicaid Other | Admitting: Family Medicine

## 2019-05-29 DIAGNOSIS — Z23 Encounter for immunization: Secondary | ICD-10-CM | POA: Diagnosis not present

## 2019-06-04 ENCOUNTER — Ambulatory Visit: Payer: Medicaid Other | Admitting: Family Medicine

## 2019-06-24 ENCOUNTER — Ambulatory Visit: Payer: Medicaid Other | Attending: Family Medicine | Admitting: Family Medicine

## 2019-06-24 ENCOUNTER — Encounter: Payer: Self-pay | Admitting: Family Medicine

## 2019-06-24 ENCOUNTER — Other Ambulatory Visit: Payer: Self-pay

## 2019-06-24 VITALS — BP 149/76 | HR 99 | Ht 66.0 in | Wt 122.8 lb

## 2019-06-24 DIAGNOSIS — Z1159 Encounter for screening for other viral diseases: Secondary | ICD-10-CM

## 2019-06-24 DIAGNOSIS — Z79899 Other long term (current) drug therapy: Secondary | ICD-10-CM | POA: Insufficient documentation

## 2019-06-24 DIAGNOSIS — E109 Type 1 diabetes mellitus without complications: Secondary | ICD-10-CM

## 2019-06-24 DIAGNOSIS — D649 Anemia, unspecified: Secondary | ICD-10-CM | POA: Diagnosis not present

## 2019-06-24 DIAGNOSIS — Z888 Allergy status to other drugs, medicaments and biological substances status: Secondary | ICD-10-CM | POA: Insufficient documentation

## 2019-06-24 DIAGNOSIS — Z794 Long term (current) use of insulin: Secondary | ICD-10-CM | POA: Insufficient documentation

## 2019-06-24 DIAGNOSIS — Z8249 Family history of ischemic heart disease and other diseases of the circulatory system: Secondary | ICD-10-CM | POA: Diagnosis not present

## 2019-06-24 DIAGNOSIS — I10 Essential (primary) hypertension: Secondary | ICD-10-CM | POA: Diagnosis not present

## 2019-06-24 DIAGNOSIS — Z114 Encounter for screening for human immunodeficiency virus [HIV]: Secondary | ICD-10-CM | POA: Insufficient documentation

## 2019-06-24 DIAGNOSIS — N946 Dysmenorrhea, unspecified: Secondary | ICD-10-CM | POA: Diagnosis not present

## 2019-06-24 LAB — GLUCOSE, POCT (MANUAL RESULT ENTRY): POC Glucose: 154 mg/dl — AB (ref 70–99)

## 2019-06-24 LAB — POCT GLYCOSYLATED HEMOGLOBIN (HGB A1C): HbA1c, POC (controlled diabetic range): 6.7 % (ref 0.0–7.0)

## 2019-06-24 MED ORDER — NOVOLOG MIX 70/30 FLEXPEN (70-30) 100 UNIT/ML ~~LOC~~ SUPN: PEN_INJECTOR | SUBCUTANEOUS | 6 refills | Status: DC

## 2019-06-24 MED ORDER — LISINOPRIL-HYDROCHLOROTHIAZIDE 20-12.5 MG PO TABS: 1.0000 | ORAL_TABLET | Freq: Every day | ORAL | 1 refills | Status: DC

## 2019-06-24 MED ORDER — ACCU-CHEK FASTCLIX LANCETS MISC: 11 refills | Status: DC

## 2019-06-24 MED ORDER — INSULIN ASPART 100 UNIT/ML ~~LOC~~ SOLN: SUBCUTANEOUS | 6 refills | Status: DC

## 2019-06-24 MED ORDER — IBUPROFEN 600 MG PO TABS: 600.0000 mg | ORAL_TABLET | Freq: Three times a day (TID) | ORAL | 1 refills | Status: DC | PRN

## 2019-06-24 NOTE — Progress Notes (Signed)
Subjective:  Patient ID: Morgan Moon, female    DOB: Oct 04, 1995  Age: 24 y.o. MRN: [id number]  CC: Diabetes   HPI Morgan Moon is a 24 year old female with a history of type 1 diabetes mellitus (A1c 6.7), hypertension, vitiligo, and anemia who presents today for a follow-up visit.  She has low low sugars in the 60-80 in the mornings; but overall sugars have been controlled and she is compliant with prescribed regimen of insulin.  Denies presence of blurry vision, numbness in extremity.  Doing well on her antihypertensive. She has bad cramps in her back at the onset of her periods and Ibuprofen and Tylenol do not provide relief. She recently had a tooth infection which is over now after treatment with antibiotics. She has no additional concerns today. Past Medical History:  Diagnosis Date  . Diabetes mellitus without complication ([place])   . Hypertension     No past surgical history on file.  Family History  Problem Relation Age of Onset  . Hypertension Maternal Aunt   . Hypertension Maternal Uncle   . Hypertension Maternal Grandmother   . Hypertension Maternal Grandfather     Allergies  Allergen Reactions  . Amlodipine Other (See Comments)    headaches    Outpatient Medications Prior to Visit  Medication Sig Dispense Refill  . Accu-Chek FastClix Lancets MISC Use as instructed to check blood sugar up to 3 times daily. 102 each 11  . B-D UF III MINI PEN NEEDLES 31G X 5 MM MISC USE AS DIRECTED TWICE DAILY FOR INSULIN DOSING 100 each 12  . Blood Glucose Monitoring Suppl (ACCU-CHEK GUIDE ME) w/Device KIT 1 kit by Does not apply route daily. 1 kit 0  . ferrous sulfate 325 (65 FE) MG tablet Take 1 tablet (325 mg total) by mouth 2 (two) times daily with a meal. 60 tablet 2  . glucose blood (ACCU-CHEK GUIDE) test strip Use as instructed to check blood sugar up to 3 times daily. 100 each 12  . insulin aspart protamine - aspart (NOVOLOG MIX 70/30 FLEXPEN) (70-30) 100 UNIT/ML  FlexPen INJECT 15 UNITS UNDER THE SKIN TWICE DAILY WITH A MEAL 15 mL 6  . Insulin Syringe-Needle U-100 (BD INSULIN SYRINGE ULTRAFINE) 31G X 15/64" 0.5 ML MISC Use as directed twice daily. 300 each 3  . lisinopril-hydrochlorothiazide (ZESTORETIC) 20-12.5 MG tablet Take 1 tablet by mouth daily. 90 tablet 1  . ondansetron (ZOFRAN ODT) 8 MG disintegrating tablet 1/2- 1 tablet q 8 hr prn nausea, vomiting 20 tablet 0  . insulin aspart (NOVOLOG) 100 UNIT/ML injection 0 to 12 units subcutaneously 3 times daily before meals as per sliding scale (Patient not taking: Reported on 06/24/2019) 3 vial 6  . lidocaine (XYLOCAINE) 2 % solution Use as directed 5 mLs in the mouth or throat every 6 (six) hours as needed for mouth pain. And spit out after (Patient not taking: Reported on 06/24/2019) 100 mL 0   No facility-administered medications prior to visit.     ROS Review of Systems  Constitutional: Negative for activity change, appetite change and fatigue.  HENT: Negative for congestion, sinus pressure and sore throat.   Eyes: Negative for visual disturbance.  Respiratory: Negative for cough, chest tightness, shortness of breath and wheezing.   Cardiovascular: Negative for chest pain and palpitations.  Gastrointestinal: Negative for abdominal distention, abdominal pain and constipation.  Endocrine: Negative for polydipsia.  Genitourinary: Negative for dysuria and frequency.  Musculoskeletal: Negative for arthralgias and back pain.  Skin:  Negative for rash.  Neurological: Negative for tremors, light-headedness and numbness.  Hematological: Does not bruise/bleed easily.  Psychiatric/Behavioral: Negative for agitation and behavioral problems.    Objective:  BP (!) 149/76   Pulse (!) 121   Ht '5\' 6"'$  (1.676 m)   Wt 122 lb 12.8 oz (55.7 kg)   SpO2 100%   BMI 19.82 kg/m   BP/Weight 06/24/2019 05/11/2019 10/27/9831  Systolic BP 825 053 976  Diastolic BP 76 78 83  Wt. (Lbs) 122.8 - 128.2  BMI 19.82 - 20.69       Physical Exam Constitutional:      Appearance: She is well-developed.  Neck:     Vascular: No JVD.  Cardiovascular:     Rate and Rhythm: Normal rate.     Heart sounds: Normal heart sounds. No murmur.  Pulmonary:     Effort: Pulmonary effort is normal.     Breath sounds: Normal breath sounds. No wheezing or rales.  Chest:     Chest wall: No tenderness.  Abdominal:     General: Bowel sounds are normal. There is no distension.     Palpations: Abdomen is soft. There is no mass.     Tenderness: There is no abdominal tenderness.  Musculoskeletal:        General: Normal range of motion.     Right lower leg: No edema.     Left lower leg: No edema.  Skin:    Comments: Diffuse hypopigmented patches  Neurological:     Mental Status: She is alert and oriented to person, place, and time.  Psychiatric:        Mood and Affect: Mood normal.     CMP Latest Ref Rng & Units 02/28/2018 07/23/2017 03/10/2016  Glucose 70 - 99 mg/dL 85 271(H) 76  BUN 6 - 20 mg/dL '14 10 8  '$ Creatinine 0.44 - 1.00 mg/dL 0.70 0.74 0.72  Sodium 135 - 145 mmol/L 137 139 139  Potassium 3.5 - 5.1 mmol/L 3.7 4.3 4.3  Chloride 98 - 111 mmol/L 100 99 106  CO2 20 - 29 mmol/L - 26 28  Calcium 8.7 - 10.2 mg/dL - 9.8 9.3  Total Protein 6.0 - 8.5 g/dL - 7.5 -  Total Bilirubin 0.0 - 1.2 mg/dL - <0.2 -  Alkaline Phos 39 - 117 IU/L - 108 -  AST 0 - 40 IU/L - 21 -  ALT 0 - 32 IU/L - 18 -    Lipid Panel     Component Value Date/Time   CHOL 149 03/24/2016 1120   TRIG 46 03/24/2016 1120   HDL 60 03/24/2016 1120   CHOLHDL 2.5 03/24/2016 1120   VLDL 9 03/24/2016 1120   LDLCALC 80 03/24/2016 1120    CBC    Component Value Date/Time   WBC 4.7 07/23/2017 1710   WBC 4.9 06/27/2015 1220   RBC 4.69 07/23/2017 1710   RBC 5.54 (H) 06/27/2015 1220   HGB 10.9 (L) 02/28/2018 1440   HGB 9.1 (L) 07/23/2017 1710   HCT 32.0 (L) 02/28/2018 1440   HCT 31.1 (L) 07/23/2017 1710   PLT 353 07/23/2017 1710   MCV 66 (L)  07/23/2017 1710   MCH 19.4 (L) 07/23/2017 1710   MCH 17.3 (L) 06/27/2015 1220   MCHC 29.3 (L) 07/23/2017 1710   MCHC 29.1 (L) 06/27/2015 1220   RDW 19.1 (H) 07/23/2017 1710   LYMPHSABS 1.8 07/23/2017 1710   MONOABS 0.4 06/27/2015 1220   EOSABS 0.1 07/23/2017 1710   BASOSABS 0.0  07/23/2017 1710    Lab Results  Component Value Date   HGBA1C 6.7 06/24/2019     Assessment & Plan:  1. Type 1 diabetes mellitus without complication (HCC) Controlled with A1c of 6.7 Reduced nighttime dose of NovoLog 70/30 from 15 units to 13 units due to early morning hypoglycemia, continue morning dose of NovoLog 70/30. - POCT glucose (manual entry) - POCT glycosylated hemoglobin (Hb A1C) - insulin aspart protamine - aspart (NOVOLOG MIX 70/30 FLEXPEN) (70-30) 100 UNIT/ML FlexPen; INJECT 15 UNITS UNDER THE SKIN IN THE MORNING AND 13 AT NIGHT  Dispense: 15 mL; Refill: 6 - insulin aspart (NOVOLOG) 100 UNIT/ML injection; 0 to 12 units subcutaneously 3 times daily before meals as per sliding scale  Dispense: 30 mL; Refill: 6 - CMP14+EGFR - Accu-Chek FastClix Lancets MISC; Use as instructed to check blood sugar up to 3 times daily.  Dispense: 102 each; Refill: 11  2. Essential hypertension Slightly elevated No regimen change today-she attributes this to being upset prior to the visit Counseled on blood pressure goal of less than 130/80, low-sodium, DASH diet, medication compliance, 150 minutes of moderate intensity exercise per week. Discussed medication compliance, adverse effects. - lisinopril-hydrochlorothiazide (ZESTORETIC) 20-12.5 MG tablet; Take 1 tablet by mouth daily.  Dispense: 90 tablet; Refill: 1  3. Menstrual cramp - ibuprofen (ADVIL) 600 MG tablet; Take 1 tablet (600 mg total) by mouth every 8 (eight) hours as needed.  Dispense: 30 tablet; Refill: 1  4. Screening for viral disease - HIV Antibody (routine testing w rflx)       Charlott Rakes, MD, FAAFP. [facility]  [facility] [place], [place]   06/24/2019, 2:37 PM

## 2019-06-24 NOTE — Progress Notes (Signed)
Having very bad cramps during her menstrual.

## 2019-06-24 NOTE — Patient Instructions (Signed)
Dysmenorrhea Dysmenorrhea means painful cramps during your period (menstrual period). You will have pain in your lower belly (abdomen). The pain is caused by the tightening (contracting) of the muscles of the womb (uterus). The pain may be mild or very bad. With this condition, you may:  Have a headache.  Feel sick to your stomach (nauseous).  Throw up (vomit).  Have lower back pain. Follow these instructions at home: Helping pain and cramping   Put heat on your lower back or belly when you have pain or cramps. Use the heat source that your doctor tells you to use. ? Place a towel between your skin and the heat. ? Leave the heat on for 20-30 minutes. ? Remove the heat if your skin turns bright red. This is especially important if you cannot feel pain, heat, or cold. ? Do not have a heating pad on during sleep.  Do aerobic exercises. These include walking, swimming, or biking. These may help with cramps.  Massage your lower back or belly. This may help lessen pain. General instructions  Take over-the-counter and prescription medicines only as told by your doctor.  Do not drive or use heavy machinery while taking prescription pain medicine.  Avoid alcohol and caffeine during and right before your period. These can make cramps worse.  Do not use any products that have nicotine or tobacco. These include cigarettes and e-cigarettes. If you need help quitting, ask your doctor.  Keep all follow-up visits as told by your doctor. This is important. Contact a doctor if:  You have pain that gets worse.  You have pain that does not get better with medicine.  You have pain during sex.  You feel sick to your stomach or you throw up during your period, and medicine does not help. Get help right away if:  You pass out (faint). Summary  Dysmenorrhea means painful cramps during your period (menstrual period).  Put heat on your lower back or belly when you have pain or cramps.  Do  exercises like walking, swimming, or biking to help with cramps.  Contact a doctor if you have pain during sex. This information is not intended to replace advice given to you by your health care provider. Make sure you discuss any questions you have with your health care provider. Document Revised: 02/16/2017 Document Reviewed: 03/23/2016 Elsevier Patient Education  2020 Else[employer] Inc[employer]  

## 2019-06-25 LAB — CMP14+EGFR
ALT: 13 [IU]/L (ref 0–32)
AST: 22 [IU]/L (ref 0–40)
Albumin/Globulin Ratio: 1.5 (ref 1.2–2.2)
Albumin: 4.7 g/dL (ref 3.9–5.0)
Alkaline Phosphatase: 96 [IU]/L (ref 39–117)
BUN/Creatinine Ratio: 14 (ref 9–23)
BUN: 10 mg/dL (ref 6–20)
Bilirubin Total: <0.2 mg/dL (ref 0.0–1.2)
CO2: 24 mmol/L (ref 20–29)
Calcium: 9.5 mg/dL (ref 8.7–10.2)
Chloride: 102 mmol/L (ref 96–106)
Creatinine, Ser: 0.7 mg/dL (ref 0.57–1.00)
GFR calc Af Amer: 141 mL/min/{1.73_m2}
GFR calc non Af Amer: 123 mL/min/{1.73_m2}
Globulin, Total: 3.1 g/dL (ref 1.5–4.5)
Glucose: 195 mg/dL — ABNORMAL HIGH (ref 65–99)
Potassium: 4.4 mmol/L (ref 3.5–5.2)
Sodium: 141 mmol/L (ref 134–144)
Total Protein: 7.8 g/dL (ref 6.0–8.5)

## 2019-06-25 LAB — HIV ANTIBODY (ROUTINE TESTING W REFLEX): HIV Screen 4th Generation wRfx: NONREACTIVE

## 2019-06-26 DIAGNOSIS — Z23 Encounter for immunization: Secondary | ICD-10-CM | POA: Diagnosis not present

## 2019-07-08 ENCOUNTER — Encounter: Payer: Self-pay | Admitting: Family Medicine

## 2019-07-09 ENCOUNTER — Other Ambulatory Visit: Payer: Self-pay | Admitting: Family Medicine

## 2019-07-09 MED ORDER — ACCU-CHEK GUIDE VI STRP: ORAL_STRIP | 12 refills | Status: DC

## 2019-07-09 MED ORDER — CARBOXYMETHYLCELLUL-GLYCERIN 0.5-0.9 % OP SOLN: 1.0000 [drp] | OPHTHALMIC | 2 refills | Status: DC | PRN

## 2019-09-04 ENCOUNTER — Encounter: Payer: Self-pay | Admitting: Family Medicine

## 2019-09-04 ENCOUNTER — Other Ambulatory Visit: Payer: Self-pay | Admitting: Family Medicine

## 2019-09-04 MED ORDER — FERROUS SULFATE 325 (65 FE) MG PO TABS: 325.0000 mg | ORAL_TABLET | Freq: Two times a day (BID) | ORAL | 2 refills | Status: DC

## 2019-09-13 ENCOUNTER — Other Ambulatory Visit: Payer: Self-pay | Admitting: Family Medicine

## 2019-10-11 ENCOUNTER — Other Ambulatory Visit: Payer: Self-pay | Admitting: Family Medicine

## 2019-10-11 NOTE — Telephone Encounter (Signed)
Requested Prescriptions  Pending Prescriptions Disp Refills   B-D UF III MINI PEN NEEDLES 31G X 5 MM MISC [Pharmacy Med Name: B-D PEN NDL MINI 31GX5MM(3/16)PRPL] 100 each 12    Sig: USE AS DIRECTED TWICE DAILY FOR INSULIN DOSING     Endocrinology: Diabetes - Testing Supplies Passed - 10/11/2019  1:18 PM      Passed - Valid encounter within last 12 months    Recent Outpatient Visits          3 months ago Type 1 diabetes mellitus without complication (HCC)   [place] [place], Odette Horns, MD   1 year ago Type 1 diabetes mellitus without complication (HCC)   [place] [facility] [place], Odette Horns, MD   1 year ago Type 1 diabetes mellitus with other specified complication [facility])   [place] [facility] [place], Odette Horns, MD   1 year ago Type 1 diabetes mellitus without complication [facility])    [facility] [place], Odette Horns, MD   1 year ago Type 1 diabetes mellitus without complication [facility])    [facility] Hoy Register, MD

## 2019-10-17 ENCOUNTER — Other Ambulatory Visit: Payer: Self-pay | Admitting: Family Medicine

## 2019-10-17 NOTE — Telephone Encounter (Signed)
Requested medication (s) are due for refill today: yes  Requested medication (s) are on the active medication list: yes  Last refill:  09/15/2019  Future visit scheduled: no  Notes to clinic:  this refill cannot be delegated    Requested Prescriptions  Pending Prescriptions Disp Refills   ondansetron (ZOFRAN-ODT) 8 MG disintegrating tablet [Pharmacy Med Name: ONDANSETRON ODT 8MG  TABLETS] 20 tablet 0    Sig: DISSOLVE 1/2 TO 1 TABLET ON THE TONGUE EVERY 8 HOURS AS NEEDED FOR NAUSEA OR VOMITING      Not Delegated - Gastroenterology: Antiemetics Failed - 10/17/2019 10:50 AM      Failed - This refill cannot be delegated      Passed - Valid encounter within last 6 months    Recent Outpatient Visits           3 months ago Type 1 diabetes mellitus without complication (HCC)   [place] [facility] [place], [place], MD   1 year ago Type 1 diabetes mellitus without complication (HCC)   [place] [facility] [place], [place], MD   1 year ago Type 1 diabetes mellitus with other specified complication [facility])   [place] [facility] [place], [place], MD   1 year ago Type 1 diabetes mellitus without complication [facility])   [place] [facility] [place], [place], MD   1 year ago Type 1 diabetes mellitus without complication [facility])   [place] [facility], MD

## 2019-10-29 ENCOUNTER — Other Ambulatory Visit: Payer: Self-pay | Admitting: Family Medicine

## 2019-10-29 DIAGNOSIS — I10 Essential (primary) hypertension: Secondary | ICD-10-CM

## 2019-10-30 ENCOUNTER — Encounter: Payer: Self-pay | Admitting: Family Medicine

## 2019-10-31 ENCOUNTER — Other Ambulatory Visit: Payer: Self-pay | Admitting: Family Medicine

## 2019-10-31 NOTE — Telephone Encounter (Signed)
Requested  medications are  due for refill today yes  Requested medications are on the active medication list yes  Last refill 6/17  Last visit 06/2019  Future visit scheduled no  Notes to clinic Failed protocol due to Fe and Ferritin labs out of date.

## 2019-11-16 ENCOUNTER — Other Ambulatory Visit: Payer: Self-pay | Admitting: Family Medicine

## 2019-11-16 DIAGNOSIS — E109 Type 1 diabetes mellitus without complications: Secondary | ICD-10-CM

## 2020-02-02 ENCOUNTER — Other Ambulatory Visit: Payer: Self-pay | Admitting: Family Medicine

## 2020-02-02 NOTE — Telephone Encounter (Signed)
Requested medications are due for refill today yes  Requested medications are on the active medication list yes  Last refill 7/30  Last visit Do not see this med/dx addressed  Future visit scheduled no  Notes to clinic Not Delegated.

## 2020-02-03 ENCOUNTER — Other Ambulatory Visit: Payer: Self-pay | Admitting: Family Medicine

## 2020-02-03 NOTE — Telephone Encounter (Signed)
Requested medication (s) are due for refill today: no  Requested medication (s) are on the active medication list: yes  Last refill:  10/17/2019  Future visit scheduled: no  Notes to clinic:  this refill cannot be delegated    Requested Prescriptions  Pending Prescriptions Disp Refills   ondansetron (ZOFRAN-ODT) 8 MG disintegrating tablet [Pharmacy Med Name: ONDANSETRON ODT 8MG  TABLETS] 20 tablet 0    Sig: DISSOLVE 1/2 TO 1 TABLET ON THE TONGUE EVERY 8 HOURS AS NEEDED FOR NAUSEA OR VOMITING      Not Delegated - Gastroenterology: Antiemetics Failed - 02/03/2020  8:04 AM      Failed - This refill cannot be delegated      Failed - Valid encounter within last 6 months    Recent Outpatient Visits           7 months ago Type 1 diabetes mellitus without complication (HCC)   [place] [facility] [place], [place], MD   1 year ago Type 1 diabetes mellitus without complication (HCC)   [place] [facility] [place], [place], MD   1 year ago Type 1 diabetes mellitus with other specified complication [facility])   [place] [facility] [place], [place], MD   1 year ago Type 1 diabetes mellitus without complication [facility])   [place] [facility], [place], MD   2 years ago Type 1 diabetes mellitus without complication [facility])   [place] [facility], MD

## 2020-02-16 ENCOUNTER — Encounter: Payer: Self-pay | Admitting: Family Medicine

## 2020-02-25 ENCOUNTER — Ambulatory Visit: Payer: Medicaid Other | Admitting: Family Medicine

## 2020-03-03 ENCOUNTER — Ambulatory Visit: Payer: Medicaid Other | Admitting: Family Medicine

## 2020-03-21 ENCOUNTER — Other Ambulatory Visit: Payer: Self-pay | Admitting: Family Medicine

## 2020-03-22 NOTE — Telephone Encounter (Signed)
Requested medication (s) are due for refill today: yes  Requested medication (s) are on the active medication list: yes  Last refill:  02/03/20 #20  Future visit scheduled: yes  Notes to clinic:  Please review for refill. Refill not delegated per protocol    Requested Prescriptions  Pending Prescriptions Disp Refills   ondansetron (ZOFRAN-ODT) 8 MG disintegrating tablet [Pharmacy Med Name: ONDANSETRON ODT 8MG  TABLETS] 20 tablet 0    Sig: DISSOLVE 1/2 TO 1 TABLET ON THE TONGUE EVERY 8 HOURS AS NEEDED FOR NAUSEA OR VOMITING      Not Delegated - Gastroenterology: Antiemetics Failed - 03/21/2020  9:21 AM      Failed - This refill cannot be delegated      Failed - Valid encounter within last 6 months    Recent Outpatient Visits           9 months ago Type 1 diabetes mellitus without complication (HCC)   [place] [facility] [place], [place], MD   1 year ago Type 1 diabetes mellitus without complication (HCC)   [place] [facility] [place], [place], MD   1 year ago Type 1 diabetes mellitus with other specified complication [facility])   [place] [facility] [place], [place], MD   2 years ago Type 1 diabetes mellitus without complication (HCC)   [place] [facility] [place], [place], MD   2 years ago Type 1 diabetes mellitus without complication [facility])   [place] [facility], MD       Future Appointments             In 1 month Hoy Register, MD [facility]

## 2020-04-02 ENCOUNTER — Encounter: Payer: Self-pay | Admitting: Family Medicine

## 2020-04-11 ENCOUNTER — Other Ambulatory Visit: Payer: Self-pay | Admitting: Family Medicine

## 2020-04-11 DIAGNOSIS — I10 Essential (primary) hypertension: Secondary | ICD-10-CM

## 2020-04-21 ENCOUNTER — Ambulatory Visit: Payer: Medicaid Other | Admitting: Family Medicine

## 2020-04-26 ENCOUNTER — Ambulatory Visit: Payer: Medicaid Other | Attending: Family Medicine | Admitting: Family Medicine

## 2020-04-26 ENCOUNTER — Encounter: Payer: Self-pay | Admitting: Family Medicine

## 2020-04-26 ENCOUNTER — Other Ambulatory Visit: Payer: Self-pay

## 2020-04-26 VITALS — BP 146/79 | HR 122 | Ht 66.0 in | Wt 127.2 lb

## 2020-04-26 DIAGNOSIS — D649 Anemia, unspecified: Secondary | ICD-10-CM

## 2020-04-26 DIAGNOSIS — Z1159 Encounter for screening for other viral diseases: Secondary | ICD-10-CM | POA: Diagnosis not present

## 2020-04-26 DIAGNOSIS — E10649 Type 1 diabetes mellitus with hypoglycemia without coma: Secondary | ICD-10-CM | POA: Diagnosis not present

## 2020-04-26 DIAGNOSIS — R000 Tachycardia, unspecified: Secondary | ICD-10-CM

## 2020-04-26 DIAGNOSIS — E109 Type 1 diabetes mellitus without complications: Secondary | ICD-10-CM | POA: Diagnosis not present

## 2020-04-26 DIAGNOSIS — I10 Essential (primary) hypertension: Secondary | ICD-10-CM

## 2020-04-26 LAB — GLUCOSE, POCT (MANUAL RESULT ENTRY): POC Glucose: 97 mg/dl (ref 70–99)

## 2020-04-26 LAB — POCT GLYCOSYLATED HEMOGLOBIN (HGB A1C): HbA1c, POC (controlled diabetic range): 6.4 % (ref 0.0–7.0)

## 2020-04-26 MED ORDER — NOVOLOG MIX 70/30 FLEXPEN (70-30) 100 UNIT/ML ~~LOC~~ SUPN: PEN_INJECTOR | SUBCUTANEOUS | 6 refills | Status: DC

## 2020-04-26 MED ORDER — METOPROLOL SUCCINATE ER 25 MG PO TB24: 12.5000 mg | ORAL_TABLET | Freq: Every day | ORAL | 1 refills | Status: DC

## 2020-04-26 MED ORDER — INSULIN ASPART 100 UNIT/ML ~~LOC~~ SOLN
SUBCUTANEOUS | 6 refills | Status: DC
Start: 2020-04-26 — End: 2021-01-04

## 2020-04-26 MED ORDER — LISINOPRIL-HYDROCHLOROTHIAZIDE 20-25 MG PO TABS
1.0000 | ORAL_TABLET | Freq: Every day | ORAL | 3 refills | Status: DC
Start: 2020-04-26 — End: 2021-07-05

## 2020-04-26 MED ORDER — ONDANSETRON 4 MG PO TBDP: 4.0000 mg | ORAL_TABLET | Freq: Three times a day (TID) | ORAL | 2 refills | Status: DC | PRN

## 2020-04-26 NOTE — Patient Instructions (Signed)
Hypoglycemia Hypoglycemia is when the sugar (glucose) level in your blood is too low. Low blood sugar can happen to people who have diabetes and people who do not have diabetes. Low blood sugar can happen quickly, and it can be an emergency. What are the causes? This condition happens most often in people who have diabetes and may be caused by:  Diabetes medicine.  Not eating enough, or not eating often enough.  Doing more physical activity.  Drinking alcohol on an empty stomach. If you do not have diabetes, hypoglycemia may be caused by:  A tumor in the pancreas.  Not eating enough, or not eating for long periods at a time (fasting).  A very bad infection or illness.  Problems after having weight loss (bariatric) surgery.  Kidney failure or liver failure.  Certain medicines. What increases the risk? This condition is more likely to develop in people who:  Have diabetes and take medicines to lower their blood sugar.  Abuse alcohol.  Have a very bad illness. What are the signs or symptoms? Symptoms depend on whether your low blood sugar is mild, moderate, or very low. Mild  Hunger.  Feeling worried or nervous (anxious).  Sweating and feeling clammy.  Feeling dizzy or light-headed.  Being sleepy or having trouble sleeping.  Feeling like you may vomit (nauseous).  A fast heartbeat.  A headache.  Blurry vision.  Being irritable or grouchy.  Tingling or loss of feeling (numbness) around your mouth, lips, or tongue.  Trouble with moving (coordination). Moderate  Confusion and poor judgment.  Behavior changes.  Weakness.  Uneven heartbeats. Very low Very low blood sugar (severe hypoglycemia) is a medical emergency. It can cause:  Fainting.  Jerky movements that you cannot control (seizure).  Loss of consciousness (coma).  Death. How is this treated? Treating low blood sugar Low blood sugar is often treated by eating or drinking something  sugary right away. The snack should contain 15 grams of a fast-acting carb (carbohydrate). Options include:  4 oz (120 mL) of fruit juice.  4-6 oz (120-150 mL) of regular soda (not diet soda).  8 oz (240 mL) of low-fat milk.  Several pieces of hard candy. Check food labels to find out how many to eat for 15 grams.  1 Tbsp (15 mL) of sugar or honey. Treating low blood sugar if you have diabetes If you can think clearly and swallow safely, follow the 15:15 rule:  Take 15 grams of a fast-acting carb. Talk with your doctor about how much you should take.  Always keep a source of fast-acting carb with you, such as: ? Sugar tablets (glucose pills). Take 4 pills. ? Several pieces of hard candy. Check food labels to see how many pieces to eat for 15 grams. ? 4 oz (120 mL) of fruit juice. ? 4-6 oz (120-150 mL) of regular (not diet) soda. ? 1 Tbsp (15 mL) of honey or sugar.  Check your blood sugar 15 minutes after you take the carb.  If your blood sugar is still at or below 70 mg/dL (3.9 mmol/L), take 15 grams of a carb again.  If your blood sugar does not go above 70 mg/dL (3.9 mmol/L) after 3 tries, get help right away.  After your blood sugar goes back to normal, eat a meal or a snack within 1 hour.   Treating very low blood sugar If your blood sugar is at or below 54 mg/dL (3 mmol/L), you have very low blood sugar, or severe   hypoglycemia. This is an emergency. Get medical help right away. If you have very low blood sugar and you cannot eat or drink, you will need to be given a hormone called glucagon. A family member or friend should learn how to check your blood sugar and how to give you glucagon. Ask your doctor if you need to have an emergency glucagon kit at home. Very low blood sugar may also need to be treated in a hospital. Follow these instructions at home: General instructions  Take over-the-counter and prescription medicines only as told by your doctor.  Stay aware of your  blood sugar as told by your doctor.  If you drink alcohol: ? Limit how much you use to:  0-1 drink a day for nonpregnant women.  0-2 drinks a day for men. ? Be aware of how much alcohol is in your drink. In the [place] one drink equals one 12 oz bottle of beer (355 mL), one 5 oz glass of wine (148 mL), or one 1 oz glass of hard liquor (44 mL).  Keep all follow-up visits as told by your doctor. This is important. If you have diabetes:  Always have a rapid-acting carb (15 grams) option with you to treat low blood sugar.  Follow your diabetes care plan as told by your doctor. Make sure you: ? Know the symptoms of low blood sugar. ? Check your blood sugar as often as told by your doctor. Always check it before and after exercise. ? Always check your blood sugar before you drive. ? Take your medicines as told. ? Follow your meal plan. ? Eat on time. Do not skip meals.  Share your diabetes care plan with: ? Your work or school. ? People you live with.  Carry a card or wear jewelry that says you have diabetes.   Contact a doctor if:  You have trouble keeping your blood sugar in your target range.  You have low blood sugar often. Get help right away if:  You still have symptoms after you eat or drink something that contains 15 grams of fast-acting carb and you cannot get your blood sugar above 70 mg/dL by following the 15:15 rule.  Your blood sugar is at or below 54 mg/dL (3 mmol/L).  You have a seizure.  You faint. These symptoms may be an emergency. Do not wait to see if the symptoms will go away. Get medical help right away. Call your local emergency services (911 in the [place] Do not drive yourself to the hospital. Summary  Hypoglycemia happens when the level of sugar (glucose) in your blood is too low.  Low blood sugar can happen to people who have diabetes and people who do not have diabetes. Low blood sugar can happen quickly, and it can be an emergency.  Make sure you  know the symptoms of low blood sugar and know how to treat it.  Always keep a source of sugar (fast-acting carb) with you to treat low blood sugar. This information is not intended to replace advice given to you by your health care provider. Make sure you discuss any questions you have with your health care provider. Document Revised: 01/29/2019 Document Reviewed: 01/29/2019 Elsevier Patient Education  2021 [employer].

## 2020-04-26 NOTE — Progress Notes (Signed)
Subjective:  Patient ID: Morgan Moon, female    DOB: 09/07/1995  Age: 24 y.o. MRN: [id number]  CC: Diabetes   HPI Morgan Moon is a 25 year old female with a history of type 1 diabetes mellitus (A1c 6.4), hypertension, vitiligo, and anemia who presents today for a follow-up visit.  She has had some sugars in the 50 range but has been asymptomatic.  Denies presence of numbness in extremities.  Endorses compliance with her medications. Has appointment with Ophthalmology on 2/210/22. Doing well on her antihypertensive. She is tachycardic but informs me that when she checks her heart rate at home it is usually within the normal limit. For her anemia she takes OTC iron tablets. She has no additional concerns today.  Past Medical History:  Diagnosis Date  . Diabetes mellitus without complication ([place])   . Hypertension     No past surgical history on file.  Family History  Problem Relation Age of Onset  . Hypertension Maternal Aunt   . Hypertension Maternal Uncle   . Hypertension Maternal Grandmother   . Hypertension Maternal Grandfather     Allergies  Allergen Reactions  . Amlodipine Other (See Comments)    headaches    Outpatient Medications Prior to Visit  Medication Sig Dispense Refill  . Accu-Chek FastClix Lancets MISC USE TO CHECK BLOOD SUGAR UP TO THREE TIMES DAILY AS DIRECTED 102 each 11  . B-D UF III MINI PEN NEEDLES 31G X 5 MM MISC USE AS DIRECTED TWICE DAILY FOR INSULIN DOSING 100 each 12  . Blood Glucose Monitoring Suppl (ACCU-CHEK GUIDE ME) w/Device KIT 1 kit by Does not apply route daily. 1 kit 0  . carboxymethylcellul-glycerin (REFRESH OPTIVE) 0.5-0.9 % ophthalmic solution Place 1 drop into both eyes as needed for dry eyes. 15 mL 2  . FEROSUL 325 (65 Fe) MG tablet TAKE 1 TABLET BY MOUTH TWICE DAILY WITH MEAL. 60 tablet 2  . glucose blood (ACCU-CHEK GUIDE) test strip Use as instructed to check blood sugar up to 3 times daily. 100 each 12  . ibuprofen  (ADVIL) 600 MG tablet Take 1 tablet (600 mg total) by mouth every 8 (eight) hours as needed. 30 tablet 1  . Insulin Syringe-Needle U-100 (BD INSULIN SYRINGE ULTRAFINE) 31G X 15/64" 0.5 ML MISC Use as directed twice daily. 300 each 3  . insulin aspart (NOVOLOG) 100 UNIT/ML injection 0 to 12 units subcutaneously 3 times daily before meals as per sliding scale 30 mL 6  . insulin aspart protamine - aspart (NOVOLOG MIX 70/30 FLEXPEN) (70-30) 100 UNIT/ML FlexPen INJECT 15 UNITS UNDER THE SKIN IN THE MORNING AND 13 AT NIGHT 15 mL 6  . lisinopril-hydrochlorothiazide (ZESTORETIC) 20-12.5 MG tablet TAKE 1 TABLET BY MOUTH DAILY 30 tablet 0  . ondansetron (ZOFRAN-ODT) 8 MG disintegrating tablet DISSOLVE 1/2 TO 1 TABLET ON THE TONGUE EVERY 8 HOURS AS NEEDED FOR NAUSEA OR VOMITING 20 tablet 0  . lidocaine (XYLOCAINE) 2 % solution Use as directed 5 mLs in the mouth or throat every 6 (six) hours as needed for mouth pain. And spit out after (Patient not taking: No sig reported) 100 mL 0   No facility-administered medications prior to visit.     ROS Review of Systems  Constitutional: Negative for activity change, appetite change and fatigue.  HENT: Negative for congestion, sinus pressure and sore throat.   Eyes: Negative for visual disturbance.  Respiratory: Negative for cough, chest tightness, shortness of breath and wheezing.   Cardiovascular: Negative for chest  pain and palpitations.  Gastrointestinal: Negative for abdominal distention, abdominal pain and constipation.  Endocrine: Negative for polydipsia.  Genitourinary: Negative for dysuria and frequency.  Musculoskeletal: Negative for arthralgias and back pain.  Skin: Positive for rash.  Neurological: Negative for tremors, light-headedness and numbness.  Hematological: Does not bruise/bleed easily.  Psychiatric/Behavioral: Negative for agitation and behavioral problems.    Objective:  BP (!) 146/79   Pulse (!) 122   Ht $R'5\' 6"'nL$  (1.676 m)   Wt 127 lb  3.2 oz (57.7 kg)   SpO2 99%   BMI 20.53 kg/m   BP/Weight 04/26/2020 06/24/2019 0/76/2263  Systolic BP 335 456 256  Diastolic BP 79 76 78  Wt. (Lbs) 127.2 122.8 -  BMI 20.53 19.82 -      Physical Exam Constitutional:      Appearance: She is well-developed.  Neck:     Vascular: No JVD.  Cardiovascular:     Rate and Rhythm: Tachycardia present.     Heart sounds: Normal heart sounds. No murmur heard.   Pulmonary:     Effort: Pulmonary effort is normal.     Breath sounds: Normal breath sounds. No wheezing or rales.  Chest:     Chest wall: No tenderness.  Abdominal:     General: Bowel sounds are normal. There is no distension.     Palpations: Abdomen is soft. There is no mass.     Tenderness: There is no abdominal tenderness.  Musculoskeletal:        General: Normal range of motion.     Right lower leg: No edema.     Left lower leg: No edema.  Skin:    Comments: Vitiliginous rash diffusely distributed  Neurological:     Mental Status: She is alert and oriented to person, place, and time.  Psychiatric:        Mood and Affect: Mood normal.     CMP Latest Ref Rng & Units 06/24/2019 02/28/2018 07/23/2017  Glucose 65 - 99 mg/dL 195(H) 85 271(H)  BUN 6 - 20 mg/dL $Remove'10 14 10  'yLJXLUZ$ Creatinine 0.57 - 1.00 mg/dL 0.70 0.70 0.74  Sodium 134 - 144 mmol/L 141 137 139  Potassium 3.5 - 5.2 mmol/L 4.4 3.7 4.3  Chloride 96 - 106 mmol/L 102 100 99  CO2 20 - 29 mmol/L 24 - 26  Calcium 8.7 - 10.2 mg/dL 9.5 - 9.8  Total Protein 6.0 - 8.5 g/dL 7.8 - 7.5  Total Bilirubin 0.0 - 1.2 mg/dL <0.2 - <0.2  Alkaline Phos 39 - 117 IU/L 96 - 108  AST 0 - 40 IU/L 22 - 21  ALT 0 - 32 IU/L 13 - 18    Lipid Panel     Component Value Date/Time   CHOL 149 03/24/2016 1120   TRIG 46 03/24/2016 1120   HDL 60 03/24/2016 1120   CHOLHDL 2.5 03/24/2016 1120   VLDL 9 03/24/2016 1120   LDLCALC 80 03/24/2016 1120    CBC    Component Value Date/Time   WBC 4.7 07/23/2017 1710   WBC 4.9 06/27/2015 1220   RBC  4.69 07/23/2017 1710   RBC 5.54 (H) 06/27/2015 1220   HGB 10.9 (L) 02/28/2018 1440   HGB 9.1 (L) 07/23/2017 1710   HCT 32.0 (L) 02/28/2018 1440   HCT 31.1 (L) 07/23/2017 1710   PLT 353 07/23/2017 1710   MCV 66 (L) 07/23/2017 1710   MCH 19.4 (L) 07/23/2017 1710   MCH 17.3 (L) 06/27/2015 1220   MCHC 29.3 (L)  07/23/2017 1710   MCHC 29.1 (L) 06/27/2015 1220   RDW 19.1 (H) 07/23/2017 1710   LYMPHSABS 1.8 07/23/2017 1710   MONOABS 0.4 06/27/2015 1220   EOSABS 0.1 07/23/2017 1710   BASOSABS 0.0 07/23/2017 1710    Lab Results  Component Value Date   HGBA1C 6.4 04/26/2020    Assessment & Plan:  1. Type 1 diabetes mellitus with hypoglycemia without coma (HCC) Controlled with A1c of 6.4 I have decreased NovoLog 70/30 dose to prevent hypoglycemia Discussed management of hypoglycemia Counseled on Diabetic diet, my plate method, 161 minutes of moderate intensity exercise/week Blood sugar logs with fasting goals of 80-120 mg/dl, random of less than 180 and in the event of sugars less than 60 mg/dl or greater than 400 mg/dl encouraged to notify the clinic. Advised on the need for annual eye exams, annual foot exams, Pneumonia vaccine. - POCT glucose (manual entry) - POCT glycosylated hemoglobin (Hb A1C) - insulin aspart protamine - aspart (NOVOLOG MIX 70/30 FLEXPEN) (70-30) 100 UNIT/ML FlexPen; INJECT 13 UNITS UNDER THE SKIN IN THE MORNING AND 11 AT NIGHT  Dispense: 15 mL; Refill: 6 - insulin aspart (NOVOLOG) 100 UNIT/ML injection; 0 to 12 units subcutaneously 3 times daily before meals as per sliding scale  Dispense: 30 mL; Refill: 6 - CMP14+EGFR - Microalbumin / creatinine urine ratio  2. Essential hypertension Uncontrolled Increase lisinopril/HCTZ dose Counseled on blood pressure goal of less than 130/80, low-sodium, DASH diet, medication compliance, 150 minutes of moderate intensity exercise per week. Discussed medication compliance, adverse effects. - lisinopril-hydrochlorothiazide  (ZESTORETIC) 20-25 MG tablet; Take 1 tablet by mouth daily.  Dispense: 90 tablet; Refill: 3  3. Anemia, unspecified type Currently on OTC iron supplements - CBC with Differential/Platelet  4. Need for hepatitis C screening test - HCV RNA quant rflx ultra or genotyp([facility])  5. Tachycardia Due to tachycardia we will add low-dose metoprolol - metoprolol succinate (TOPROL-XL) 25 MG 24 hr tablet; Take 0.5 tablets (12.5 mg total) by mouth daily.  Dispense: 90 tablet; Refill: 1    Meds ordered this encounter  Medications  . insulin aspart protamine - aspart (NOVOLOG MIX 70/30 FLEXPEN) (70-30) 100 UNIT/ML FlexPen    Sig: INJECT 13 UNITS UNDER THE SKIN IN THE MORNING AND 11 AT NIGHT    Dispense:  15 mL    Refill:  6    Dose decrease  . lisinopril-hydrochlorothiazide (ZESTORETIC) 20-25 MG tablet    Sig: Take 1 tablet by mouth daily.    Dispense:  90 tablet    Refill:  3  . ondansetron (ZOFRAN-ODT) 4 MG disintegrating tablet    Sig: Take 1 tablet (4 mg total) by mouth every 8 (eight) hours as needed for nausea or vomiting.    Dispense:  30 tablet    Refill:  2  . insulin aspart (NOVOLOG) 100 UNIT/ML injection    Sig: 0 to 12 units subcutaneously 3 times daily before meals as per sliding scale    Dispense:  30 mL    Refill:  6  . metoprolol succinate (TOPROL-XL) 25 MG 24 hr tablet    Sig: Take 0.5 tablets (12.5 mg total) by mouth daily.    Dispense:  90 tablet    Refill:  1    Follow-up: Return in about 6 months (around 10/24/2020) for Diabetes.       Charlott Rakes, MD, FAAFP. [facility] and [place], [place]   04/26/2020, 12:49 PM

## 2020-04-27 LAB — CBC WITH DIFFERENTIAL/PLATELET
Basophils Absolute: 0 10*3/uL (ref 0.0–0.2)
Basos: 1 %
EOS (ABSOLUTE): 0.1 10*3/uL (ref 0.0–0.4)
Eos: 3 %
Hematocrit: 37.5 % (ref 34.0–46.6)
Hemoglobin: 12 g/dL (ref 11.1–15.9)
Immature Grans (Abs): 0 10*3/uL (ref 0.0–0.1)
Immature Granulocytes: 0 %
Lymphocytes Absolute: 1.2 10*3/uL (ref 0.7–3.1)
Lymphs: 41 %
MCH: 25.3 pg — ABNORMAL LOW (ref 26.6–33.0)
MCHC: 32 g/dL (ref 31.5–35.7)
MCV: 79 fL (ref 79–97)
Monocytes Absolute: 0.4 10*3/uL (ref 0.1–0.9)
Monocytes: 13 %
Neutrophils Absolute: 1.3 10*3/uL — ABNORMAL LOW (ref 1.4–7.0)
Neutrophils: 42 %
Platelets: 320 10*3/uL (ref 150–450)
RBC: 4.74 x10E6/uL (ref 3.77–5.28)
RDW: 15.4 % (ref 11.7–15.4)
WBC: 2.9 10*3/uL — ABNORMAL LOW (ref 3.4–10.8)

## 2020-04-27 LAB — CMP14+EGFR
ALT: 21 IU/L (ref 0–32)
AST: 25 IU/L (ref 0–40)
Albumin/Globulin Ratio: 1.5 (ref 1.2–2.2)
Albumin: 5 g/dL (ref 3.9–5.0)
Alkaline Phosphatase: 99 IU/L (ref 44–121)
BUN/Creatinine Ratio: 11 (ref 9–23)
BUN: 9 mg/dL (ref 6–20)
Bilirubin Total: <0.2 mg/dL (ref 0.0–1.2)
CO2: 24 mmol/L (ref 20–29)
Calcium: 10.3 mg/dL — ABNORMAL HIGH (ref 8.7–10.2)
Chloride: 96 mmol/L (ref 96–106)
Creatinine, Ser: 0.8 mg/dL (ref 0.57–1.00)
GFR calc Af Amer: 119 mL/min/{1.73_m2} (ref >59)
GFR calc non Af Amer: 104 mL/min/{1.73_m2} (ref >59)
Globulin, Total: 3.3 g/dL (ref 1.5–4.5)
Glucose: 100 mg/dL — ABNORMAL HIGH (ref 65–99)
Potassium: 4.4 mmol/L (ref 3.5–5.2)
Sodium: 138 mmol/L (ref 134–144)
Total Protein: 8.3 g/dL (ref 6.0–8.5)

## 2020-04-27 LAB — MICROALBUMIN / CREATININE URINE RATIO
Creatinine, Urine: 50.7 mg/dL
Microalb/Creat Ratio: 8 mg/g creat (ref 0–29)
Microalbumin, Urine: 4.2 ug/mL

## 2020-04-27 LAB — HCV RNA QUANT RFLX ULTRA OR GENOTYP: HCV Quant Baseline: NOT DETECTED IU/mL

## 2020-04-29 DIAGNOSIS — H04123 Dry eye syndrome of bilateral lacrimal glands: Secondary | ICD-10-CM | POA: Diagnosis not present

## 2020-04-29 DIAGNOSIS — E109 Type 1 diabetes mellitus without complications: Secondary | ICD-10-CM | POA: Diagnosis not present

## 2020-05-01 ENCOUNTER — Encounter: Payer: Self-pay | Admitting: Family Medicine

## 2020-05-11 ENCOUNTER — Other Ambulatory Visit: Payer: Self-pay | Admitting: Family Medicine

## 2020-05-11 DIAGNOSIS — I10 Essential (primary) hypertension: Secondary | ICD-10-CM

## 2020-05-26 ENCOUNTER — Encounter: Payer: Self-pay | Admitting: Family Medicine

## 2020-05-27 ENCOUNTER — Other Ambulatory Visit: Payer: Self-pay | Admitting: Family Medicine

## 2020-05-27 MED ORDER — DEXAMETHASONE 0.5 MG/5ML PO ELIX: 0.5000 mg | ORAL_SOLUTION | Freq: Three times a day (TID) | ORAL | 0 refills | Status: DC

## 2020-06-21 ENCOUNTER — Other Ambulatory Visit: Payer: Self-pay

## 2020-06-21 ENCOUNTER — Encounter: Payer: Self-pay | Admitting: Family Medicine

## 2020-06-21 DIAGNOSIS — E109 Type 1 diabetes mellitus without complications: Secondary | ICD-10-CM

## 2020-06-21 MED ORDER — ACCU-CHEK GUIDE ME W/DEVICE KIT: 1.0000 | PACK | Freq: Every day | 0 refills | Status: DC

## 2020-06-21 MED ORDER — ACCU-CHEK FASTCLIX LANCETS MISC: 11 refills | Status: DC

## 2020-06-21 MED ORDER — ACCU-CHEK GUIDE VI STRP: ORAL_STRIP | 12 refills | Status: DC

## 2020-06-22 ENCOUNTER — Other Ambulatory Visit: Payer: Self-pay | Admitting: Family Medicine

## 2020-06-22 MED ORDER — MISC. DEVICES MISC: 0 refills | Status: DC

## 2020-06-24 ENCOUNTER — Other Ambulatory Visit: Payer: Self-pay | Admitting: Family Medicine

## 2020-07-26 ENCOUNTER — Other Ambulatory Visit: Payer: Self-pay | Admitting: Family Medicine

## 2020-07-26 DIAGNOSIS — N946 Dysmenorrhea, unspecified: Secondary | ICD-10-CM

## 2020-07-26 NOTE — Telephone Encounter (Signed)
   Notes to clinic:  this script has expired Review for continued use and refill    Requested Prescriptions  Pending Prescriptions Disp Refills   ibuprofen (ADVIL) 600 MG tablet [Pharmacy Med Name: IBUPROFEN 600MG  TABLETS] 30 tablet 1    Sig: TAKE 1 TABLET(600 MG) BY MOUTH EVERY 8 HOURS AS NEEDED      Analgesics:  NSAIDS Passed - 07/26/2020  9:14 AM      Passed - Cr in normal range and within 360 days    Creat  Date Value Ref Range Status  03/10/2016 0.72 0.50 - 1.10 mg/dL Final   Creatinine, Ser  Date Value Ref Range Status  04/26/2020 0.80 0.57 - 1.00 mg/dL Final   Creatinine, POC  Date Value Ref Range Status  12/04/2016 100 mg/dL Final          Passed - HGB in normal range and within 360 days    Hemoglobin  Date Value Ref Range Status  04/26/2020 12.0 11.1 - 15.9 g/dL Final          Passed - Patient is not pregnant      Passed - Valid encounter within last 12 months    Recent Outpatient Visits           3 months ago Type 1 diabetes mellitus with hypoglycemia and without coma (HCC)   [facility] [place], [place], [place]   1 year ago Type 1 diabetes mellitus without complication (HCC)   [facility] [place], [place], [place]   1 year ago Type 1 diabetes mellitus without complication (HCC)   [facility] [place], [place], [place]   2 years ago Type 1 diabetes mellitus with other specified complication [facility])   [facility] [place], [place], [place]   2 years ago Type 1 diabetes mellitus without complication [facility])   [facility], MD

## 2020-07-29 ENCOUNTER — Encounter: Payer: Self-pay | Admitting: Family Medicine

## 2020-07-30 ENCOUNTER — Other Ambulatory Visit: Payer: Self-pay | Admitting: Family Medicine

## 2020-07-30 MED ORDER — MELOXICAM 7.5 MG PO TABS: 7.5000 mg | ORAL_TABLET | Freq: Every day | ORAL | 1 refills | Status: DC

## 2020-08-11 ENCOUNTER — Other Ambulatory Visit: Payer: Self-pay | Admitting: Family Medicine

## 2020-08-11 NOTE — Telephone Encounter (Signed)
Requested medication (s) are due for refill today: yes  Requested medication (s) are on the active medication list: yes  Last refill:  03/24/20  Future visit scheduled: no  Notes to clinic: not delegated    Requested Prescriptions  Pending Prescriptions Disp Refills   ondansetron (ZOFRAN-ODT) 8 MG disintegrating tablet [Pharmacy Med Name: ONDANSETRON ODT 8MG  TABLETS] 20 tablet 0    Sig: DISSOLVE 1/2 TO 1 TABLET ON THE TONGUE EVERY 8 HOURS AS NEEDED FOR NAUSEA OR VOMITING      Not Delegated - Gastroenterology: Antiemetics Failed - 08/11/2020  7:33 AM      Failed - This refill cannot be delegated      Passed - Valid encounter within last 6 months    Recent Outpatient Visits           3 months ago Type 1 diabetes mellitus with hypoglycemia and without coma (HCC)   [facility] [place], [place], [place]   1 year ago Type 1 diabetes mellitus without complication (HCC)   [facility] [place], [place], [place]   1 year ago Type 1 diabetes mellitus without complication (HCC)   [facility] [place], [place], [place]   2 years ago Type 1 diabetes mellitus with other specified complication [facility])   [facility] [place], [place], [place]   2 years ago Type 1 diabetes mellitus without complication [facility])   [place], [place]

## 2020-08-22 ENCOUNTER — Encounter: Payer: Self-pay | Admitting: Family Medicine

## 2020-08-24 ENCOUNTER — Other Ambulatory Visit: Payer: Self-pay | Admitting: Pharmacist

## 2020-08-24 MED ORDER — TRUEPLUS 5-BEVEL PEN NEEDLES 32G X 4 MM MISC: 2 refills | Status: DC

## 2020-11-09 ENCOUNTER — Encounter: Payer: Self-pay | Admitting: Family Medicine

## 2020-11-11 ENCOUNTER — Other Ambulatory Visit: Payer: Self-pay | Admitting: Family Medicine

## 2020-11-11 MED ORDER — GLUCERNA 1.0 CAL PO LIQD: 1.0000 | Freq: Three times a day (TID) | ORAL | 1 refills | Status: AC | PRN

## 2020-11-27 ENCOUNTER — Other Ambulatory Visit: Payer: Self-pay | Admitting: Family Medicine

## 2020-11-27 DIAGNOSIS — E109 Type 1 diabetes mellitus without complications: Secondary | ICD-10-CM

## 2020-11-27 NOTE — Telephone Encounter (Signed)
Called [facility] on [address] and spoke with pharmacist. She stated pt was still using the prescription written 1 year  ago. The prescription written and never received the most current 06/21/20.  Gave phone order for Accu-Chek FastClix Lancets SIg:Use to check blood sugar up to 3 times a day  Disp:102  RF 11.   Routing to [facility].

## 2020-12-03 ENCOUNTER — Other Ambulatory Visit: Payer: Self-pay | Admitting: Family Medicine

## 2020-12-04 NOTE — Telephone Encounter (Signed)
Requested medication (s) are due for refill today: yes  Requested medication (s) are on the active medication list: yes  Last refill:  08/11/20 #20  Future visit scheduled: yes  Notes to clinic:  med not delegated to NT to RF   Requested Prescriptions  Pending Prescriptions Disp Refills   ondansetron (ZOFRAN-ODT) 8 MG disintegrating tablet [Pharmacy Med Name: ONDANSETRON ODT 8MG  TABLETS] 20 tablet 0    Sig: DISSOLVE 1/2 TO 1 TABLET ON THE TONGUE EVERY 8 HOURS AS NEEDED FOR NAUSEA OR VOMITING     Not Delegated - Gastroenterology: Antiemetics Failed - 12/03/2020 11:19 PM      Failed - This refill cannot be delegated      Failed - Valid encounter within last 6 months    Recent Outpatient Visits           7 months ago Type 1 diabetes mellitus with hypoglycemia and without coma (HCC)   [facility] [place], [place], [place]   1 year ago Type 1 diabetes mellitus without complication (HCC)   [facility] [place], [place], [place]   2 years ago Type 1 diabetes mellitus without complication (HCC)   [facility] [place], [place], [place]   2 years ago Type 1 diabetes mellitus with other specified complication [facility])   [facility] [place], [place], [place]   2 years ago Type 1 diabetes mellitus without complication [facility])    [facility], MD       Future Appointments             In 1 month Hoy Register, MD [facility]

## 2021-01-04 ENCOUNTER — Other Ambulatory Visit: Payer: Self-pay

## 2021-01-04 ENCOUNTER — Encounter: Payer: Self-pay | Admitting: Family Medicine

## 2021-01-04 ENCOUNTER — Ambulatory Visit: Payer: Medicaid Other | Attending: Family Medicine | Admitting: Family Medicine

## 2021-01-04 VITALS — BP 148/84 | HR 115 | Ht 64.0 in | Wt 125.0 lb

## 2021-01-04 DIAGNOSIS — E1159 Type 2 diabetes mellitus with other circulatory complications: Secondary | ICD-10-CM

## 2021-01-04 DIAGNOSIS — E10649 Type 1 diabetes mellitus with hypoglycemia without coma: Secondary | ICD-10-CM | POA: Diagnosis not present

## 2021-01-04 DIAGNOSIS — I152 Hypertension secondary to endocrine disorders: Secondary | ICD-10-CM | POA: Diagnosis not present

## 2021-01-04 DIAGNOSIS — R000 Tachycardia, unspecified: Secondary | ICD-10-CM

## 2021-01-04 LAB — POCT GLYCOSYLATED HEMOGLOBIN (HGB A1C): HbA1c, POC (controlled diabetic range): 8.3 % — AB (ref 0.0–7.0)

## 2021-01-04 LAB — GLUCOSE, POCT (MANUAL RESULT ENTRY): POC Glucose: 110 mg/dl — AB (ref 70–99)

## 2021-01-04 MED ORDER — NOVOLOG MIX 70/30 FLEXPEN (70-30) 100 UNIT/ML ~~LOC~~ SUPN: PEN_INJECTOR | SUBCUTANEOUS | 6 refills | Status: DC

## 2021-01-04 MED ORDER — METOPROLOL SUCCINATE ER 25 MG PO TB24: 25.0000 mg | ORAL_TABLET | Freq: Every day | ORAL | 1 refills | Status: DC

## 2021-01-04 MED ORDER — NOVOLOG FLEXPEN 100 UNIT/ML ~~LOC~~ SOPN: 0.0000 [IU] | PEN_INJECTOR | Freq: Three times a day (TID) | SUBCUTANEOUS | 6 refills | Status: DC

## 2021-01-04 NOTE — Progress Notes (Signed)
Subjective:  Patient ID: Morgan Moon, female    DOB: 11-16-1995  Age: 25 y.o. MRN: [id number]  CC: Annual Exam   HPI Morgan Moon is a 25 y.o. year old female with a history of type 1 diabetes mellitus (A1c 8.3), hypertension, vitiligo.  Interval History: She has had low sugars to about 40 during different times of the day about every other week. She endorses eating late as sometimes she is at work and cannot find time to go eat.Highest sugars are 289 around 2pm Her A1c is 8.3 up from 6.4. She has been ingesting Oreo cookies.  Denies presence of blurry vision, numbness in extremities. She is up to date with annual exam  Compliant with her antihypertensive but her blood pressure is slightly elevated and she is tachycardic.  She is on metoprolol for both tachycardia and hypertension and endorses compliance. Denies additional concerns. Past Medical History:  Diagnosis Date   Diabetes mellitus without complication (HCC)    Hypertension     History reviewed. No pertinent surgical history.  Family History  Problem Relation Age of Onset   Hypertension Maternal Aunt    Hypertension Maternal Uncle    Hypertension Maternal Grandmother    Hypertension Maternal Grandfather     Allergies  Allergen Reactions   Amlodipine Other (See Comments)    headaches    Outpatient Medications Prior to Visit  Medication Sig Dispense Refill   Accu-Chek FastClix Lancets MISC USE TO CHECK BLOOD SUGAR UP TO THREE TIMES DAILY AS DIRECTED 102 each 11   Blood Glucose Monitoring Suppl (ACCU-CHEK GUIDE ME) w/Device KIT 1 kit by Does not apply route daily. 1 kit 0   carboxymethylcellul-glycerin (REFRESH OPTIVE) 0.5-0.9 % ophthalmic solution Place 1 drop into both eyes as needed for dry eyes. 15 mL 2   dexamethasone 0.5 MG/5ML elixir Take 5 mLs (0.5 mg total) by mouth 3 (three) times daily. Swish and spit out. 237 mL 0   FEROSUL 325 (65 Fe) MG tablet TAKE 1 TABLET BY MOUTH TWICE DAILY WITH MEAL. 60  tablet 2   glucose blood (ACCU-CHEK GUIDE) test strip Use as instructed to check blood sugar up to 3 times daily. 100 each 12   Insulin Pen Needle (TRUEPLUS 5-BEVEL PEN NEEDLES) 32G X 4 MM MISC Use to inject insulin twice daily. 100 each 2   Insulin Syringe-Needle U-100 (BD INSULIN SYRINGE ULTRAFINE) 31G X 15/64" 0.5 ML MISC Use as directed twice daily. 300 each 3   lidocaine (XYLOCAINE) 2 % solution Use as directed 5 mLs in the mouth or throat every 6 (six) hours as needed for mouth pain. And spit out after 100 mL 0   lisinopril-hydrochlorothiazide (ZESTORETIC) 20-25 MG tablet Take 1 tablet by mouth daily. 90 tablet 3   meloxicam (MOBIC) 7.5 MG tablet Take 1 tablet (7.5 mg total) by mouth daily. 30 tablet 1   Misc. Devices MISC Blood pressure monitor.  Diagnosis hypertension 1 each 0   Nutritional Supplements (GLUCERNA 1.0 CAL) LIQD Take 1 each by mouth 3 (three) times daily as needed. Strawberry or vanilla 1500 mL 1   ondansetron (ZOFRAN-ODT) 4 MG disintegrating tablet Take 1 tablet (4 mg total) by mouth every 8 (eight) hours as needed for nausea or vomiting. 30 tablet 2   ondansetron (ZOFRAN-ODT) 8 MG disintegrating tablet DISSOLVE 1/2 TO 1 TABLET ON THE TONGUE EVERY 8 HOURS AS NEEDED FOR NAUSEA OR VOMITING 20 tablet 0   insulin aspart (NOVOLOG) 100 UNIT/ML injection 0 to 12  units subcutaneously 3 times daily before meals as per sliding scale 30 mL 6   insulin aspart protamine - aspart (NOVOLOG MIX 70/30 FLEXPEN) (70-30) 100 UNIT/ML FlexPen INJECT 13 UNITS UNDER THE SKIN IN THE MORNING AND 11 AT NIGHT 15 mL 6   metoprolol succinate (TOPROL-XL) 25 MG 24 hr tablet Take 0.5 tablets (12.5 mg total) by mouth daily. 90 tablet 1   No facility-administered medications prior to visit.     ROS Review of Systems  Constitutional:  Negative for activity change, appetite change and fatigue.  HENT:  Negative for congestion, sinus pressure and sore throat.   Eyes:  Negative for visual disturbance.   Respiratory:  Negative for cough, chest tightness, shortness of breath and wheezing.   Cardiovascular:  Negative for chest pain and palpitations.  Gastrointestinal:  Negative for abdominal distention, abdominal pain and constipation.  Endocrine: Negative for polydipsia.  Genitourinary:  Negative for dysuria and frequency.  Musculoskeletal:  Negative for arthralgias and back pain.  Skin:  Negative for rash.  Neurological:  Negative for tremors, light-headedness and numbness.  Hematological:  Does not bruise/bleed easily.  Psychiatric/Behavioral:  Negative for agitation and behavioral problems.    Objective:  BP (!) 148/84   Pulse (!) 115   Ht $R'5\' 4"'kj$  (1.626 m)   Wt 125 lb (56.7 kg)   SpO2 100%   BMI 21.46 kg/m   BP/Weight 01/04/2021 07/19/7780 06/20/3534  Systolic BP 144 315 400  Diastolic BP 84 79 76  Wt. (Lbs) 125 127.2 122.8  BMI 21.46 20.53 19.82      Physical Exam Constitutional:      Appearance: She is well-developed.  Cardiovascular:     Rate and Rhythm: Tachycardia present.     Heart sounds: Normal heart sounds. No murmur heard. Pulmonary:     Effort: Pulmonary effort is normal.     Breath sounds: Normal breath sounds. No wheezing or rales.  Chest:     Chest wall: No tenderness.  Abdominal:     General: Bowel sounds are normal. There is no distension.     Palpations: Abdomen is soft. There is no mass.     Tenderness: There is no abdominal tenderness.  Musculoskeletal:        General: Normal range of motion.     Right lower leg: No edema.     Left lower leg: No edema.  Skin:    Comments: Hypopigmented lesions  distributed on entire body surface  Neurological:     Mental Status: She is alert and oriented to person, place, and time.  Psychiatric:        Mood and Affect: Mood normal.    CMP Latest Ref Rng & Units 04/26/2020 06/24/2019 02/28/2018  Glucose 65 - 99 mg/dL 100(H) 195(H) 85  BUN 6 - 20 mg/dL $Remove'9 10 14  'OjTUCwF$ Creatinine 0.57 - 1.00 mg/dL 0.80 0.70 0.70  Sodium  134 - 144 mmol/L 138 141 137  Potassium 3.5 - 5.2 mmol/L 4.4 4.4 3.7  Chloride 96 - 106 mmol/L 96 102 100  CO2 20 - 29 mmol/L 24 24 -  Calcium 8.7 - 10.2 mg/dL 10.3(H) 9.5 -  Total Protein 6.0 - 8.5 g/dL 8.3 7.8 -  Total Bilirubin 0.0 - 1.2 mg/dL <0.2 <0.2 -  Alkaline Phos 44 - 121 IU/L 99 96 -  AST 0 - 40 IU/L 25 22 -  ALT 0 - 32 IU/L 21 13 -    Lipid Panel     Component Value Date/Time  CHOL 149 03/24/2016 1120   TRIG 46 03/24/2016 1120   HDL 60 03/24/2016 1120   CHOLHDL 2.5 03/24/2016 1120   VLDL 9 03/24/2016 1120   LDLCALC 80 03/24/2016 1120    CBC    Component Value Date/Time   WBC 2.9 (L) 04/26/2020 1002   WBC 4.9 06/27/2015 1220   RBC 4.74 04/26/2020 1002   RBC 5.54 (H) 06/27/2015 1220   HGB 12.0 04/26/2020 1002   HCT 37.5 04/26/2020 1002   PLT 320 04/26/2020 1002   MCV 79 04/26/2020 1002   MCH 25.3 (L) 04/26/2020 1002   MCH 17.3 (L) 06/27/2015 1220   MCHC 32.0 04/26/2020 1002   MCHC 29.1 (L) 06/27/2015 1220   RDW 15.4 04/26/2020 1002   LYMPHSABS 1.2 04/26/2020 1002   MONOABS 0.4 06/27/2015 1220   EOSABS 0.1 04/26/2020 1002   BASOSABS 0.0 04/26/2020 1002    Lab Results  Component Value Date   HGBA1C 8.3 (A) 01/04/2021    Assessment & Plan:  1. Type 1 diabetes mellitus with hypoglycemia and without coma (HCC) A1c of 8.3 which is up from 6.4 previously Goal is less than 7.0 Due to the fact that she has been having some hypoglycemic episodes I will hold off on making regimen adjustments but rather encourage her to work on her diet and cutting out Oreo cookies Advised against delaying meals Counseled on Diabetic diet, my plate method, 854 minutes of moderate intensity exercise/week Blood sugar logs with fasting goals of 80-120 mg/dl, random of less than 937 and in the event of sugars less than 60 mg/dl or greater than 879 mg/dl encouraged to notify the clinic. Advised on the need for annual eye exams, annual foot exams, Pneumonia vaccine. - POCT glucose  (manual entry) - POCT glycosylated hemoglobin (Hb A1C) - Basic Metabolic Panel - insulin aspart (NOVOLOG FLEXPEN) 100 UNIT/ML FlexPen; Inject 0-12 Units into the skin 3 (three) times daily with meals. As per sliding scale  Dispense: 30 mL; Refill: 6 - insulin aspart protamine - aspart (NOVOLOG MIX 70/30 FLEXPEN) (70-30) 100 UNIT/ML FlexPen; INJECT 13 UNITS UNDER THE SKIN IN THE MORNING AND 11 AT NIGHT  Dispense: 15 mL; Refill: 6  2. Tachycardia Uncontrolled Will increase metoprolol dose - metoprolol succinate (TOPROL-XL) 25 MG 24 hr tablet; Take 1 tablet (25 mg total) by mouth daily.  Dispense: 90 tablet; Refill: 1  3. Hypertension associated with diabetes (HCC) Slightly above goal Hopefully increasing metoprolol dose will be beneficial.    Meds ordered this encounter  Medications   metoprolol succinate (TOPROL-XL) 25 MG 24 hr tablet    Sig: Take 1 tablet (25 mg total) by mouth daily.    Dispense:  90 tablet    Refill:  1    Dose increase   insulin aspart (NOVOLOG FLEXPEN) 100 UNIT/ML FlexPen    Sig: Inject 0-12 Units into the skin 3 (three) times daily with meals. As per sliding scale    Dispense:  30 mL    Refill:  6   insulin aspart protamine - aspart (NOVOLOG MIX 70/30 FLEXPEN) (70-30) 100 UNIT/ML FlexPen    Sig: INJECT 13 UNITS UNDER THE SKIN IN THE MORNING AND 11 AT NIGHT    Dispense:  15 mL    Refill:  6    Follow-up: Return in about 4 months (around 05/07/2021) for  Pap smear and diabetes.       Hoy Register, MD, FAAFP. [facility] [address] [place], [place] [phone]  01/04/2021, 5:41 PM

## 2021-01-04 NOTE — Patient Instructions (Signed)
Increase Metoprolol from 12.5mg  to 25 mg daily

## 2021-01-04 NOTE — Progress Notes (Signed)
Physical

## 2021-01-05 LAB — BASIC METABOLIC PANEL
BUN/Creatinine Ratio: 18 (ref 9–23)
BUN: 14 mg/dL (ref 6–20)
CO2: 24 mmol/L (ref 20–29)
Calcium: 10.2 mg/dL (ref 8.7–10.2)
Chloride: 96 mmol/L (ref 96–106)
Creatinine, Ser: 0.77 mg/dL (ref 0.57–1.00)
Glucose: 101 mg/dL — ABNORMAL HIGH (ref 70–99)
Potassium: 5 mmol/L (ref 3.5–5.2)
Sodium: 135 mmol/L (ref 134–144)
eGFR: 110 mL/min/{1.73_m2} (ref >59)

## 2021-03-09 ENCOUNTER — Encounter (HOSPITAL_COMMUNITY): Payer: Self-pay | Admitting: Emergency Medicine

## 2021-03-09 ENCOUNTER — Ambulatory Visit (HOSPITAL_COMMUNITY)
Admission: EM | Admit: 2021-03-09 | Discharge: 2021-03-09 | Disposition: A | Discharge status: Home / Self Care | Payer: Medicaid Other | Attending: Internal Medicine | Admitting: Internal Medicine

## 2021-03-09 ENCOUNTER — Encounter: Payer: Self-pay | Admitting: Family Medicine

## 2021-03-09 ENCOUNTER — Other Ambulatory Visit: Payer: Self-pay

## 2021-03-09 ENCOUNTER — Other Ambulatory Visit: Payer: Self-pay | Admitting: Family Medicine

## 2021-03-09 DIAGNOSIS — R509 Fever, unspecified: Secondary | ICD-10-CM

## 2021-03-09 DIAGNOSIS — R110 Nausea: Secondary | ICD-10-CM | POA: Diagnosis not present

## 2021-03-09 DIAGNOSIS — J029 Acute pharyngitis, unspecified: Secondary | ICD-10-CM

## 2021-03-09 LAB — POCT INFECTIOUS MONO SCREEN, ED / UC: Mono Screen: NEGATIVE

## 2021-03-09 LAB — POCT RAPID STREP A, ED / UC: Streptococcus, Group A Screen (Direct): NEGATIVE

## 2021-03-09 LAB — POC INFLUENZA A AND B ANTIGEN (URGENT CARE ONLY)
INFLUENZA A ANTIGEN, POC: NEGATIVE
INFLUENZA B ANTIGEN, POC: NEGATIVE

## 2021-03-09 MED ORDER — ACETAMINOPHEN 325 MG PO TABS
975.0000 mg | ORAL_TABLET | Freq: Once | ORAL | Status: AC
  Administered 2021-03-09: 09:00:00 975 mg via ORAL

## 2021-03-09 MED ORDER — PROMETHAZINE HCL 25 MG PO TABS: 25.0000 mg | ORAL_TABLET | Freq: Four times a day (QID) | ORAL | 0 refills | Status: DC | PRN

## 2021-03-09 MED ORDER — NIRMATRELVIR/RITONAVIR (PAXLOVID)TABLET: 3.0000 | ORAL_TABLET | Freq: Two times a day (BID) | ORAL | 0 refills | Status: AC

## 2021-03-09 MED ORDER — ACETAMINOPHEN 325 MG PO TABS
ORAL_TABLET | ORAL | Status: AC
  Filled 2021-03-09: qty 3

## 2021-03-09 NOTE — ED Provider Notes (Signed)
[place]    CSN: [id number] Arrival date & time: 03/09/21  5284      History   Chief Complaint Chief Complaint  Patient presents with   Sore Throat    HPI Morgan Moon is a 25 y.o. female.   25 year old type I diabetic female presents today with a 3-day history of severe throat pain.  She states it started abruptly on Monday.  She has been taking over-the-counter Mucinex without resolution to her symptoms.  She reports some nausea, but denies abdominal pain, vomiting, diarrhea.  She denies rash.  She has had no headache lymphadenopathy ear pain, sinus pressure, or cough.  Temperature in our office is 103, she states she has not taken her temperature at home and has not felt febrile.  She monitors her blood sugars routinely and the highest it has been is 116.  She denies any exposure to known illness or strep.   Sore Throat Pertinent negatives include no chest pain, no abdominal pain and no shortness of breath.   Past Medical History:  Diagnosis Date   Diabetes mellitus without complication ([place])    Hypertension     Patient Active Problem List   Diagnosis Date Noted   Anemia 07/23/2017   Family history of breast cancer 07/23/2017   Hypertension 07/23/2017   DM (diabetes mellitus), type 1, uncontrolled 06/30/2015   Vitiligo 13/24/4010   Systolic murmur 27/25/3664    History reviewed. No pertinent surgical history.  OB History   No obstetric history on file.      Home Medications    Prior to Admission medications   Medication Sig Start Date End Date Taking? Authorizing Provider  promethazine (PHENERGAN) 25 MG tablet Take 1 tablet (25 mg total) by mouth every 6 (six) hours as needed for nausea or vomiting. 03/09/21  Yes Yanique Mulvihill L, PA  Accu-Chek FastClix Lancets MISC USE TO CHECK BLOOD SUGAR UP TO THREE TIMES DAILY AS DIRECTED 11/27/20   Charlott Rakes, MD  Blood Glucose Monitoring Suppl (ACCU-CHEK GUIDE ME) w/Device KIT 1 kit by Does not  apply route daily. 06/21/20   Charlott Rakes, MD  carboxymethylcellul-glycerin (REFRESH OPTIVE) 0.5-0.9 % ophthalmic solution Place 1 drop into both eyes as needed for dry eyes. 07/09/19   Charlott Rakes, MD  dexamethasone 0.5 MG/5ML elixir Take 5 mLs (0.5 mg total) by mouth 3 (three) times daily. Swish and spit out. 05/27/20   Charlott Rakes, MD  FEROSUL 325 (65 Fe) MG tablet TAKE 1 TABLET BY MOUTH TWICE DAILY WITH MEAL. 11/03/19   Charlott Rakes, MD  glucose blood (ACCU-CHEK GUIDE) test strip Use as instructed to check blood sugar up to 3 times daily. 06/21/20   Charlott Rakes, MD  insulin aspart (NOVOLOG FLEXPEN) 100 UNIT/ML FlexPen Inject 0-12 Units into the skin 3 (three) times daily with meals. As per sliding scale 01/04/21   Charlott Rakes, MD  insulin aspart protamine - aspart (NOVOLOG MIX 70/30 FLEXPEN) (70-30) 100 UNIT/ML FlexPen INJECT 13 UNITS UNDER THE SKIN IN THE MORNING AND 11 AT NIGHT 01/04/21   Charlott Rakes, MD  Insulin Pen Needle (TRUEPLUS 5-BEVEL PEN NEEDLES) 32G X 4 MM MISC Use to inject insulin twice daily. 08/24/20   Charlott Rakes, MD  Insulin Syringe-Needle U-100 (BD INSULIN SYRINGE ULTRAFINE) 31G X 15/64" 0.5 ML MISC Use as directed twice daily. 12/04/16   Alfonse Spruce, FNP  lidocaine (XYLOCAINE) 2 % solution Use as directed 5 mLs in the mouth or throat every 6 (six) hours as  needed for mouth pain. And spit out after 04/23/19   Charlott Rakes, MD  lisinopril-hydrochlorothiazide (ZESTORETIC) 20-25 MG tablet Take 1 tablet by mouth daily. 04/26/20   Charlott Rakes, MD  meloxicam (MOBIC) 7.5 MG tablet Take 1 tablet (7.5 mg total) by mouth daily. 07/30/20   Charlott Rakes, MD  metoprolol succinate (TOPROL-XL) 25 MG 24 hr tablet Take 1 tablet (25 mg total) by mouth daily. 01/04/21   Charlott Rakes, MD  Misc. Devices MISC Blood pressure monitor.  Diagnosis hypertension 06/22/20   Charlott Rakes, MD  Nutritional Supplements (GLUCERNA 1.0 CAL) LIQD Take 1 each by mouth 3 (three)  times daily as needed. Strawberry or vanilla 11/11/20   Charlott Rakes, MD  ondansetron (ZOFRAN-ODT) 4 MG disintegrating tablet Take 1 tablet (4 mg total) by mouth every 8 (eight) hours as needed for nausea or vomiting. 04/26/20   Charlott Rakes, MD  ondansetron (ZOFRAN-ODT) 8 MG disintegrating tablet DISSOLVE 1/2 TO 1 TABLET ON THE TONGUE EVERY 8 HOURS AS NEEDED FOR NAUSEA OR VOMITING 12/06/20   Charlott Rakes, MD    Family History Family History  Problem Relation Age of Onset   Hypertension Maternal Aunt    Hypertension Maternal Uncle    Hypertension Maternal Grandmother    Hypertension Maternal Grandfather     Social History Social History   Tobacco Use   Smoking status: Never   Smokeless tobacco: Never  Substance Use Topics   Alcohol use: No   Drug use: No     Allergies   Amlodipine   Review of Systems Review of Systems  Constitutional:  Positive for fever. Negative for chills, diaphoresis and fatigue.  HENT:  Positive for sore throat. Negative for congestion, dental problem, ear discharge, ear pain, mouth sores, postnasal drip, rhinorrhea, sneezing and trouble swallowing.   Respiratory:  Negative for cough, chest tightness, shortness of breath and stridor.   Cardiovascular:  Negative for chest pain, palpitations and leg swelling.  Gastrointestinal:  Positive for nausea. Negative for abdominal pain, blood in stool, diarrhea and vomiting.  Musculoskeletal:  Negative for myalgias.    Physical Exam Triage Vital Signs ED Triage Vitals  Enc Vitals Group     BP 03/09/21 0842 129/82     Pulse Rate 03/09/21 0842 (!) 109     Resp 03/09/21 0842 15     Temp 03/09/21 0842 (!) 103 F (39.4 C)     Temp Source 03/09/21 0842 Oral     SpO2 03/09/21 0842 96 %     Weight --      Height --      Head Circumference --      Peak Flow --      Pain Score 03/09/21 0841 4     Pain Loc --      Pain Edu? --      Excl. in [place]? --    No data found.  Updated Vital Signs BP 129/82 (BP  Location: Left Arm)    Pulse (!) 109    Temp (!) 100.6 F (38.1 C) (Oral)    Resp 15    LMP 03/08/2021    SpO2 96%   Visual Acuity Right Eye Distance:   Left Eye Distance:   Bilateral Distance:    Right Eye Near:   Left Eye Near:    Bilateral Near:     Physical Exam Vitals and nursing note reviewed.  Constitutional:      General: She is not in acute distress.    Appearance: She is  well-developed. She is ill-appearing. She is not toxic-appearing.  HENT:     Head: Normocephalic and atraumatic.     Right Ear: Tympanic membrane and ear canal normal. No drainage, swelling or tenderness. No middle ear effusion. Tympanic membrane is not erythematous.     Left Ear: Tympanic membrane and ear canal normal. No drainage, swelling or tenderness.  No middle ear effusion. Tympanic membrane is not erythematous.     Nose: No congestion or rhinorrhea.     Mouth/Throat:     Mouth: Mucous membranes are moist.     Pharynx: Posterior oropharyngeal erythema present. No pharyngeal swelling or oropharyngeal exudate.  Eyes:     Extraocular Movements:     Right eye: Normal extraocular motion.     Left eye: Normal extraocular motion.     Conjunctiva/sclera: Conjunctivae normal.  Neck:     Thyroid: No thyromegaly.  Cardiovascular:     Rate and Rhythm: Normal rate and regular rhythm.     Heart sounds: Normal heart sounds. No murmur heard. Pulmonary:     Effort: Pulmonary effort is normal. No respiratory distress.     Breath sounds: Normal breath sounds.  Abdominal:     Palpations: Abdomen is soft.     Tenderness: There is no abdominal tenderness.  Musculoskeletal:        General: No swelling.     Cervical back: Normal range of motion and neck supple.  Lymphadenopathy:     Cervical: No cervical adenopathy.  Skin:    General: Skin is warm and dry.     Capillary Refill: Capillary refill takes less than 2 seconds.  Neurological:     Mental Status: She is alert.  Psychiatric:        Mood and Affect:  Mood normal.     UC Treatments / Results  Labs (all labs ordered are listed, but only abnormal results are displayed) Labs Reviewed  CULTURE, GROUP A STREP [facility])  POCT RAPID STREP A, ED / UC  POC INFLUENZA A AND B ANTIGEN (URGENT CARE ONLY)  POCT INFECTIOUS MONO SCREEN, ED / UC    EKG   Radiology No results found.  Procedures Procedures (including critical care time)  Medications Ordered in UC Medications  acetaminophen (TYLENOL) tablet 975 mg (975 mg Oral Given 03/09/21 0846)    Initial Impression / Assessment and Plan / UC Course  I have reviewed the triage vital signs and the nursing notes.  Pertinent labs & imaging results that were available during my care of the patient were reviewed by me and considered in my medical decision making (see chart for details).  Clinical Course as of 03/09/21 1021  Wed Mar 09, 2021  1021 Recheck temp 100.6 after tylenol [WC]    Clinical Course User Index [WC] Watkins, Jacy Brocker L, PA    Acute pharyngitis, likely viral -does not meet [facility] criteria for antibiotic treatment.  Throat culture sent Fever -alternate Tylenol and ibuprofen, increase hydration Nausea -as needed Phenergan  Final Clinical Impressions(s) / UC Diagnoses   Final diagnoses:  Acute pharyngitis, unspecified etiology  Fever, unspecified  Nausea without vomiting     Discharge Instructions      Your strep test is negative, will send out a throat culture to determine if antibiotics are needed. Alternate ibuprofen and tylenol to help with fevers. You may take the nausea medication as needed, this is a sedating medication. Return to office if any new or worsening symptoms occur.     ED Prescriptions  Medication Sig Dispense Auth. Provider   promethazine (PHENERGAN) 25 MG tablet Take 1 tablet (25 mg total) by mouth every 6 (six) hours as needed for nausea or vomiting. 30 tablet Sherie Dobrowolski L, [place]      PDMP not reviewed this encounter.   Chaney Malling, [place] 03/09/21 1022

## 2021-03-09 NOTE — Discharge Instructions (Addendum)
Your strep test is negative, will send out a throat culture to determine if antibiotics are needed. Alternate ibuprofen and tylenol to help with fevers. You may take the nausea medication as needed, this is a sedating medication. Return to office if any new or worsening symptoms occur.

## 2021-03-09 NOTE — ED Triage Notes (Signed)
Pt c/o sore throat since MOnday

## 2021-03-11 LAB — CULTURE, GROUP A STREP (THRC)

## 2021-03-12 ENCOUNTER — Other Ambulatory Visit: Payer: Self-pay | Admitting: Family Medicine

## 2021-03-12 MED ORDER — BENZONATATE 100 MG PO CAPS: 100.0000 mg | ORAL_CAPSULE | Freq: Two times a day (BID) | ORAL | 0 refills | Status: DC | PRN

## 2021-03-26 ENCOUNTER — Other Ambulatory Visit: Payer: Self-pay | Admitting: Family Medicine

## 2021-03-26 NOTE — Telephone Encounter (Signed)
Requested Prescriptions  Pending Prescriptions Disp Refills   benzonatate (TESSALON) 100 MG capsule [Pharmacy Med Name: BENZONATATE 100MG  CAPSULES] 20 capsule 0    Sig: TAKE 1 CAPSULE(100 MG) BY MOUTH TWICE DAILY AS NEEDED FOR COUGH     Ear, Nose, and Throat:  Antitussives/Expectorants Passed - 03/26/2021  6:25 PM      Passed - Valid encounter within last 12 months    Recent Outpatient Visits          2 months ago Type 1 diabetes mellitus with hypoglycemia and without coma (HCC)   [facility] [place], [place], MD   11 months ago Type 1 diabetes mellitus with hypoglycemia and without coma (HCC)   [facility] [place], [place], MD   1 year ago Type 1 diabetes mellitus without complication (HCC)   [facility] [place], [place], MD   2 years ago Type 1 diabetes mellitus without complication (HCC)   [facility] [place], [place], MD   2 years ago Type 1 diabetes mellitus with other specified complication [facility])   [facility], MD      Future Appointments            In 1 month Hoy Register, MD [facility]

## 2021-04-01 ENCOUNTER — Other Ambulatory Visit: Payer: Self-pay | Admitting: Family Medicine

## 2021-04-01 NOTE — Telephone Encounter (Signed)
Requested medication (s) are due for refill today: yes  Requested medication (s) are on the active medication list: yes  Last refill:  12/06/20  Future visit scheduled: 05/10/21  Notes to clinic:  This medication can not be delegated, please assess.   Requested Prescriptions  Pending Prescriptions Disp Refills   ondansetron (ZOFRAN-ODT) 8 MG disintegrating tablet [Pharmacy Med Name: ONDANSETRON ODT 8MG  TABLETS] 20 tablet 0    Sig: DISSOLVE 1/2 TO 1 TABLET ON THE TONGUE EVERY 8 HOURS AS NEEDED FOR NAUSEA OR VOMITING     Not Delegated - Gastroenterology: Antiemetics Failed - 04/01/2021  6:55 PM      Failed - This refill cannot be delegated      Passed - Valid encounter within last 6 months    Recent Outpatient Visits           2 months ago Type 1 diabetes mellitus with hypoglycemia and without coma (HCC)   [facility] [place], [place], MD   11 months ago Type 1 diabetes mellitus with hypoglycemia and without coma (HCC)   [facility] [place], [place], MD   1 year ago Type 1 diabetes mellitus without complication (HCC)   [facility] [place], [place], MD   2 years ago Type 1 diabetes mellitus without complication (HCC)   [facility] [place], [place], MD   2 years ago Type 1 diabetes mellitus with other specified complication [facility])   [facility], MD       Future Appointments             In 1 month Hoy Register, MD [facility]

## 2021-05-04 DIAGNOSIS — E109 Type 1 diabetes mellitus without complications: Secondary | ICD-10-CM | POA: Diagnosis not present

## 2021-05-04 DIAGNOSIS — H04123 Dry eye syndrome of bilateral lacrimal glands: Secondary | ICD-10-CM | POA: Diagnosis not present

## 2021-05-04 LAB — HM DIABETES EYE EXAM

## 2021-05-10 ENCOUNTER — Ambulatory Visit: Payer: Medicaid Other | Admitting: Family Medicine

## 2021-05-10 ENCOUNTER — Other Ambulatory Visit: Payer: Self-pay | Admitting: Family Medicine

## 2021-05-10 NOTE — Telephone Encounter (Signed)
Requested Prescriptions  Pending Prescriptions Disp Refills   BD PEN NEEDLE NANO 2ND GEN 32G X 4 MM MISC [Pharmacy Med Name: B-D NANO 2ND GEN PEN NDL [id number]] 100 each 2    Sig: USE TO [place] DAILY     Endocrinology: Diabetes - Testing Supplies Passed - 05/10/2021  8:57 AM      Passed - Valid encounter within last 12 months    Recent Outpatient Visits          4 months ago Type 1 diabetes mellitus with hypoglycemia and without coma ([place])   [place], [place], MD   1 year ago Type 1 diabetes mellitus with hypoglycemia and without coma ([place])   [place], Charlane Ferretti, MD   1 year ago Type 1 diabetes mellitus without complication ([place])   [place], Charlane Ferretti, MD   2 years ago Type 1 diabetes mellitus without complication ([place])   [place], Charlane Ferretti, MD   3 years ago Type 1 diabetes mellitus with other specified complication [facility])   [place], MD      Future Appointments            In 1 month Wynetta Emery, Dalbert Batman, MD [place]

## 2021-05-30 ENCOUNTER — Other Ambulatory Visit: Payer: Self-pay | Admitting: Family Medicine

## 2021-05-30 DIAGNOSIS — E109 Type 1 diabetes mellitus without complications: Secondary | ICD-10-CM

## 2021-05-30 NOTE — Telephone Encounter (Signed)
D;C 04/26/20. ?Requested Prescriptions  ?Refused Prescriptions Disp Refills  ?? NOVOLOG 100 UNIT/ML injection [Pharmacy Med Name: NOVOLOG U-100 INSULIN VL10ML(ORANG)] 30 mL   ?  Sig: ADMINISTER 0 TO 12 UNITS UNDER THE SKIN THREE TIMES DAILY BEFORE MEALS PER SLIDING SCALE  ?  ? Endocrinology:  Diabetes - Insulins Failed - 05/30/2021  8:29 AM  ?  ?  Failed - HBA1C is between 0 and 7.9 and within 180 days  ?  HbA1c, POC (controlled diabetic range)  ?Date Value Ref Range Status  ?01/04/2021 8.3 (A) 0.0 - 7.0 % Final  ?   ?  ?  Passed - Valid encounter within last 6 months  ?  Recent Outpatient Visits   ?      ? 4 months ago Type 1 diabetes mellitus with hypoglycemia and without coma ([place])  ? [place], Charlane Ferretti, MD  ? 1 year ago Type 1 diabetes mellitus with hypoglycemia and without coma ([place])  ? [place], Charlane Ferretti, MD  ? 1 year ago Type 1 diabetes mellitus without complication ([place])  ? [place], Charlane Ferretti, MD  ? 2 years ago Type 1 diabetes mellitus without complication ([place])  ? [place], Charlane Ferretti, MD  ? 3 years ago Type 1 diabetes mellitus with other specified complication ([place])  ? [place] Charlott Rakes, MD  ?  ?  ?Future Appointments   ?        ? In 1 month Ladell Pier, MD [place]  ?  ? ?  ?  ?  ? ?

## 2021-06-09 ENCOUNTER — Encounter: Payer: Self-pay | Admitting: Family Medicine

## 2021-06-10 ENCOUNTER — Other Ambulatory Visit: Payer: Self-pay | Admitting: Family Medicine

## 2021-06-10 DIAGNOSIS — E10649 Type 1 diabetes mellitus with hypoglycemia without coma: Secondary | ICD-10-CM

## 2021-06-10 MED ORDER — NOVOLOG FLEXPEN 100 UNIT/ML ~~LOC~~ SOPN: 0.0000 [IU] | PEN_INJECTOR | Freq: Three times a day (TID) | SUBCUTANEOUS | 6 refills | Status: DC

## 2021-06-30 ENCOUNTER — Encounter: Payer: Self-pay | Admitting: Internal Medicine

## 2021-06-30 ENCOUNTER — Other Ambulatory Visit (HOSPITAL_COMMUNITY)
Admission: RE | Admit: 2021-06-30 | Discharge: 2021-06-30 | Disposition: A | Discharge status: Home / Self Care | Payer: Medicaid Other | Source: Ambulatory Visit | Attending: Family Medicine | Admitting: Family Medicine

## 2021-06-30 ENCOUNTER — Ambulatory Visit: Payer: Medicaid Other | Attending: Family Medicine | Admitting: Internal Medicine

## 2021-06-30 VITALS — BP 119/80 | HR 86 | Resp 16 | Wt 126.8 lb

## 2021-06-30 DIAGNOSIS — Z124 Encounter for screening for malignant neoplasm of cervix: Secondary | ICD-10-CM

## 2021-06-30 MED ORDER — FLUCONAZOLE 150 MG PO TABS: 150.0000 mg | ORAL_TABLET | Freq: Every day | ORAL | 0 refills | Status: DC

## 2021-06-30 NOTE — Progress Notes (Signed)
? ? ?Patient ID: Morgan Moon, female    DOB: [birth year]  MRN: [id number] ? ?CC: Gynecologic Exam ? ? ?Subjective: ?Morgan Moon is a 26 y.o. female who presents for Pap ?Her concerns today include:  ?history of type 1 diabetes mellitus (A1c 8.3), hypertension, vitiligo. ? ?GYN History:  ?Pt is G0P0 ?Any hx of abn paps?: no ?Menses regular or irregular?:  yes ?How long does menses last? 5 days ?Menstrual flow light or heavy?: light bleeding.  LNMP 4/3-10/2021 ?Method of birth control?:  none ?Any vaginal dischg at this time?: no ?Dysuria?: no ?Any hx of STI?:  no ?Sexually active with how many partners: no.  Not on any birth control ?Desires STI screen: no ?Last MMG:  ?Family hx of uterine, cervical or breast cancer?:  Breast CA in mom in her 2s. Not sure if mom was tested for breast cancer gene. ? ? ?Patient Active Problem List  ? Diagnosis Date Noted  ? Anemia 07/23/2017  ? Family history of breast cancer 07/23/2017  ? Hypertension 07/23/2017  ? DM (diabetes mellitus), type 1, uncontrolled 06/30/2015  ? Vitiligo 06/30/2015  ? Systolic murmur 30/09/6224  ?  ? ?Current Outpatient Medications on File Prior to Visit  ?Medication Sig Dispense Refill  ? Accu-Chek FastClix Lancets MISC USE TO CHECK BLOOD SUGAR UP TO THREE TIMES DAILY AS DIRECTED 102 each 11  ? BD PEN NEEDLE NANO 2ND GEN 32G X 4 MM MISC USE TO INJECT INSULIN TWICE DAILY 100 each 2  ? benzonatate (TESSALON) 100 MG capsule TAKE 1 CAPSULE(100 MG) BY MOUTH TWICE DAILY AS NEEDED FOR COUGH (Patient not taking: Reported on 06/30/2021) 20 capsule 0  ? Blood Glucose Monitoring Suppl (ACCU-CHEK GUIDE ME) w/Device KIT 1 kit by Does not apply route daily. 1 kit 0  ? carboxymethylcellul-glycerin (REFRESH OPTIVE) 0.5-0.9 % ophthalmic solution Place 1 drop into both eyes as needed for dry eyes. 15 mL 2  ? dexamethasone 0.5 MG/5ML elixir Take 5 mLs (0.5 mg total) by mouth 3 (three) times daily. Swish and spit out. 237 mL 0  ? FEROSUL 325 (65 Fe) MG tablet TAKE 1 TABLET  BY MOUTH TWICE DAILY WITH MEAL. 60 tablet 2  ? glucose blood (ACCU-CHEK GUIDE) test strip Use as instructed to check blood sugar up to 3 times daily. 100 each 12  ? insulin aspart (NOVOLOG FLEXPEN) 100 UNIT/ML FlexPen Inject 0-12 Units into the skin 3 (three) times daily with meals. As per sliding scale 30 mL 6  ? insulin aspart protamine - aspart (NOVOLOG MIX 70/30 FLEXPEN) (70-30) 100 UNIT/ML FlexPen INJECT 13 UNITS UNDER THE SKIN IN THE MORNING AND 11 AT NIGHT 15 mL 6  ? Insulin Syringe-Needle U-100 (BD INSULIN SYRINGE ULTRAFINE) 31G X 15/64" 0.5 ML MISC Use as directed twice daily. 300 each 3  ? lidocaine (XYLOCAINE) 2 % solution Use as directed 5 mLs in the mouth or throat every 6 (six) hours as needed for mouth pain. And spit out after 100 mL 0  ? lisinopril-hydrochlorothiazide (ZESTORETIC) 20-25 MG tablet Take 1 tablet by mouth daily. 90 tablet 3  ? meloxicam (MOBIC) 7.5 MG tablet Take 1 tablet (7.5 mg total) by mouth daily. 30 tablet 1  ? metoprolol succinate (TOPROL-XL) 25 MG 24 hr tablet Take 1 tablet (25 mg total) by mouth daily. 90 tablet 1  ? Misc. Devices MISC Blood pressure monitor.  Diagnosis hypertension 1 each 0  ? Nutritional Supplements (GLUCERNA 1.0 CAL) LIQD Take 1 each by mouth 3 (three)  times daily as needed. Strawberry or vanilla 1500 mL 1  ? ondansetron (ZOFRAN-ODT) 8 MG disintegrating tablet DISSOLVE 1/2 TO 1 TABLET ON THE TONGUE EVERY 8 HOURS AS NEEDED FOR NAUSEA OR VOMITING 20 tablet 0  ? promethazine (PHENERGAN) 25 MG tablet Take 1 tablet (25 mg total) by mouth every 6 (six) hours as needed for nausea or vomiting. (Patient not taking: Reported on 06/30/2021) 30 tablet 0  ? ?No current facility-administered medications on file prior to visit.  ? ? ?Allergies  ?Allergen Reactions  ? Amlodipine Other (See Comments)  ?  headaches  ? ? ?Social History  ? ?Socioeconomic History  ? Marital status: Single  ?  Spouse name: Not on file  ? Number of children: Not on file  ? Years of education: Not  on file  ? Highest education level: Not on file  ?Occupational History  ? Not on file  ?Tobacco Use  ? Smoking status: Never  ? Smokeless tobacco: Never  ?Substance and Sexual Activity  ? Alcohol use: No  ? Drug use: No  ? Sexual activity: Not on file  ?Other Topics Concern  ? Not on file  ?Social History Narrative  ? Not on file  ? ?Social Determinants of Health  ? ?Financial Resource Strain: Not on file  ?Food Insecurity: Not on file  ?Transportation Needs: Not on file  ?Physical Activity: Not on file  ?Stress: Not on file  ?Social Connections: Not on file  ?Intimate Partner Violence: Not on file  ? ? ?Family History  ?Problem Relation Age of Onset  ? Hypertension Maternal Aunt   ? Hypertension Maternal Uncle   ? Hypertension Maternal Grandmother   ? Hypertension Maternal Grandfather   ? ? ?No past surgical history on file. ? ?ROS: ?Review of Systems ?Negative except as stated above ? ?PHYSICAL EXAM: ?BP 119/80   Pulse 86   Resp 16   Wt 126 lb 12.8 oz (57.5 kg)   SpO2 97%   BMI 21.77 kg/m?   ?Physical Exam ? ?General appearance - alert, well appearing, young AAFand in no distress ?Mental status - normal mood, behavior, speech, dress, motor activity, and thought processes ?Pelvic - Boykin Reaper, CMA present: normal external genitalia, vulva, vagina, cervix, uterus and adnexa.  Cottage cheese appearing dischg around cervix ? ? ? ?  Latest Ref Rng & Units 01/04/2021  ?  3:34 PM 04/26/2020  ? 10:02 AM 06/24/2019  ?  3:13 PM  ?CMP  ?Glucose 70 - 99 mg/dL 101   100   195    ?BUN 6 - 20 mg/dL $Remove'14   9   10    'WcYYoTq$ ?Creatinine 0.57 - 1.00 mg/dL 0.77   0.80   0.70    ?Sodium 134 - 144 mmol/L 135   138   141    ?Potassium 3.5 - 5.2 mmol/L 5.0   4.4   4.4    ?Chloride 96 - 106 mmol/L 96   96   102    ?CO2 20 - 29 mmol/L $RemoveB'24   24   24    'xYduSNKJ$ ?Calcium 8.7 - 10.2 mg/dL 10.2   10.3   9.5    ?Total Protein 6.0 - 8.5 g/dL  8.3   7.8    ?Total Bilirubin 0.0 - 1.2 mg/dL  <0.2   <0.2    ?Alkaline Phos 44 - 121 IU/L  99   96    ?AST 0 - 40 IU/L   25   22    ?  ALT 0 - 32 IU/L  21   13    ? ?Lipid Panel  ?   ?Component Value Date/Time  ? CHOL 149 03/24/2016 1120  ? TRIG 46 03/24/2016 1120  ? HDL 60 03/24/2016 1120  ? CHOLHDL 2.5 03/24/2016 1120  ? VLDL 9 03/24/2016 1120  ? LDLCALC 80 03/24/2016 1120  ? ? ?CBC ?   ?Component Value Date/Time  ? WBC 2.9 (L) 04/26/2020 1002  ? WBC 4.9 06/27/2015 1220  ? RBC 4.74 04/26/2020 1002  ? RBC 5.54 (H) 06/27/2015 1220  ? HGB 12.0 04/26/2020 1002  ? HCT 37.5 04/26/2020 1002  ? PLT 320 04/26/2020 1002  ? MCV 79 04/26/2020 1002  ? MCH 25.3 (L) 04/26/2020 1002  ? MCH 17.3 (L) 06/27/2015 1220  ? MCHC 32.0 04/26/2020 1002  ? MCHC 29.1 (L) 06/27/2015 1220  ? RDW 15.4 04/26/2020 1002  ? LYMPHSABS 1.2 04/26/2020 1002  ? MONOABS 0.4 06/27/2015 1220  ? EOSABS 0.1 04/26/2020 1002  ? BASOSABS 0.0 04/26/2020 1002  ? ? ?ASSESSMENT AND PLAN: ?1. Pap smear for cervical cancer screening ?- Cytology - PAP ?Treat empirically for yeast based on exam finding.  ?There are no diagnoses linked to this encounter. ? ? ?Patient was given the opportunity to ask questions.  Patient verbalized understanding of the plan and was able to repeat key elements of the plan.  ? ?This documentation was completed using Radio producer.  Any transcriptional errors are unintentional. ? ?No orders of the defined types were placed in this encounter. ? ? ? ?Requested Prescriptions  ? ? No prescriptions requested or ordered in this encounter  ? ? ?No follow-ups on file. ? ?Karle Plumber, MD, FACP ?

## 2021-07-04 ENCOUNTER — Other Ambulatory Visit: Payer: Self-pay | Admitting: Family Medicine

## 2021-07-04 LAB — CYTOLOGY - PAP: Diagnosis: NEGATIVE

## 2021-07-05 ENCOUNTER — Other Ambulatory Visit: Payer: Self-pay | Admitting: Family Medicine

## 2021-07-05 DIAGNOSIS — I10 Essential (primary) hypertension: Secondary | ICD-10-CM

## 2021-07-05 DIAGNOSIS — R000 Tachycardia, unspecified: Secondary | ICD-10-CM

## 2021-07-11 ENCOUNTER — Other Ambulatory Visit: Payer: Self-pay | Admitting: Family Medicine

## 2021-08-28 ENCOUNTER — Other Ambulatory Visit: Payer: Self-pay | Admitting: Family Medicine

## 2021-08-29 ENCOUNTER — Other Ambulatory Visit: Payer: Self-pay | Admitting: Family Medicine

## 2021-08-30 ENCOUNTER — Encounter (HOSPITAL_BASED_OUTPATIENT_CLINIC_OR_DEPARTMENT_OTHER): Payer: Medicaid Other | Admitting: Family Medicine

## 2021-08-30 ENCOUNTER — Other Ambulatory Visit: Payer: Self-pay | Admitting: Family Medicine

## 2021-08-30 DIAGNOSIS — B001 Herpesviral vesicular dermatitis: Secondary | ICD-10-CM

## 2021-08-30 MED ORDER — VALACYCLOVIR HCL 1 G PO TABS: 2000.0000 mg | ORAL_TABLET | Freq: Two times a day (BID) | ORAL | 0 refills | Status: AC

## 2021-08-30 NOTE — Telephone Encounter (Signed)

## 2021-08-30 NOTE — Telephone Encounter (Signed)
Requested medication (s) are due for refill today:   Provider to review  Requested medication (s) are on the active medication list:   Yes  Future visit scheduled:   Yes   Last ordered: 04/04/2021 #20, 0 refills  Non delegated refill reason returned   Requested Prescriptions  Pending Prescriptions Disp Refills   ondansetron (ZOFRAN-ODT) 8 MG disintegrating tablet [Pharmacy Med Name: ONDANSETRON ODT 8MG  TABLETS] 20 tablet 0    Sig: DISSOLVE 1/2 TO 1 TABLET ON THE TONGUE EVERY 8 HOURS AS NEEDED FOR NAUSEA OR VOMITING     Not Delegated - Gastroenterology: Antiemetics - ondansetron Failed - 08/28/2021  5:31 PM      Failed - This refill cannot be delegated      Failed - AST in normal range and within 360 days    AST  Date Value Ref Range Status  04/26/2020 25 0 - 40 IU/L Final         Failed - ALT in normal range and within 360 days    ALT  Date Value Ref Range Status  04/26/2020 21 0 - 32 IU/L Final         Passed - Valid encounter within last 6 months    Recent Outpatient Visits           2 months ago Pap smear for cervical cancer screening   [facility] [place], [place], MD   7 months ago Type 1 diabetes mellitus with hypoglycemia and without coma (H[facility])   [facility] [facility] [place], [place], MD   1 year ago Type 1 diabetes mellitus with hypoglycemia and without coma (HCC)   [place] [facility] [facility] [place], [place], MD   2 years ago Type 1 diabetes mellitus without complication [facility])   [place] [facility] [place], [place], MD   3 years ago Type 1 diabetes mellitus without complication [facility])    [facility], MD       Future Appointments             In 1 month Hoy Register, MD [facility]

## 2021-10-10 ENCOUNTER — Ambulatory Visit: Payer: Medicaid Other | Attending: Family Medicine | Admitting: Family Medicine

## 2021-10-10 ENCOUNTER — Encounter: Payer: Self-pay | Admitting: Family Medicine

## 2021-10-10 VITALS — BP 114/71 | HR 106 | Temp 99.0°F | Ht 64.0 in | Wt 128.2 lb

## 2021-10-10 DIAGNOSIS — I152 Hypertension secondary to endocrine disorders: Secondary | ICD-10-CM

## 2021-10-10 DIAGNOSIS — E10649 Type 1 diabetes mellitus with hypoglycemia without coma: Secondary | ICD-10-CM

## 2021-10-10 DIAGNOSIS — E1159 Type 2 diabetes mellitus with other circulatory complications: Secondary | ICD-10-CM

## 2021-10-10 LAB — POCT GLYCOSYLATED HEMOGLOBIN (HGB A1C): HbA1c, POC (controlled diabetic range): 9.4 % — AB (ref 0.0–7.0)

## 2021-10-10 LAB — GLUCOSE, POCT (MANUAL RESULT ENTRY): POC Glucose: 210 mg/dl — AB (ref 70–99)

## 2021-10-10 MED ORDER — NOVOLOG FLEXPEN 100 UNIT/ML ~~LOC~~ SOPN: 0.0000 [IU] | PEN_INJECTOR | Freq: Three times a day (TID) | SUBCUTANEOUS | 3 refills | Status: DC

## 2021-10-10 MED ORDER — NOVOLOG MIX 70/30 FLEXPEN (70-30) 100 UNIT/ML ~~LOC~~ SUPN: PEN_INJECTOR | SUBCUTANEOUS | 3 refills | Status: DC

## 2021-10-10 NOTE — Progress Notes (Signed)
Subjective:  Patient ID: Morgan Moon, female    DOB: 02/22/1996  Age: 26 y.o. MRN: [id number]  CC: Diabetes   HPI Morgan Moon is a 26 y.o. year old female with a history of type 1 diabetes mellitus (A1c 9.4), hypertension, vitiligo, learning disability.  Interval History: A1c is 9.4 up from 8.3 Sugars have been elevated going up to 300s.  She is accompanied by her mother who states she has been eating a lot of snacks as she has withdrawal full of snacks at home. She endorses adherence with her NovoLog 70/30 as well as her short acting NovoLog. Denies presence of visual concerns or neuropathy.  Adherent with her antihypertensive   She has had a learning disability which was diagnosed in 2008 per mom for which she receives Social Security benefits.  Mom is trying to get her on her benefits at work. Morgan Moon currently does not work but per mom she is able to keep her part-time job but has difficulty counting money. Past Medical History:  Diagnosis Date   Diabetes mellitus without complication ([place])    Hypertension     No past surgical history on file.  Family History  Problem Relation Age of Onset   Hypertension Maternal Aunt    Hypertension Maternal Uncle    Hypertension Maternal Grandmother    Hypertension Maternal Grandfather     Social History   Socioeconomic History   Marital status: Single    Spouse name: Not on file   Number of children: Not on file   Years of education: Not on file   Highest education level: Not on file  Occupational History   Not on file  Tobacco Use   Smoking status: Never   Smokeless tobacco: Never  Substance and Sexual Activity   Alcohol use: No   Drug use: No   Sexual activity: Not on file  Other Topics Concern   Not on file  Social History Narrative   Not on file   Social Determinants of Health   Financial Resource Strain: Not on file  Food Insecurity: Not on file  Transportation Needs: Not on file  Physical Activity:  Not on file  Stress: Not on file  Social Connections: Not on file    Allergies  Allergen Reactions   Amlodipine Other (See Comments)    headaches    Outpatient Medications Prior to Visit  Medication Sig Dispense Refill   Accu-Chek FastClix Lancets MISC USE TO CHECK BLOOD SUGAR UP TO THREE TIMES DAILY AS DIRECTED 102 each 11   BD PEN NEEDLE NANO 2ND GEN 32G X 4 MM MISC USE TO INJECT INSULIN TWICE DAILY 100 each 2   Blood Glucose Monitoring Suppl (ACCU-CHEK GUIDE ME) w/Device KIT 1 kit by Does not apply route daily. 1 kit 0   FEROSUL 325 (65 Fe) MG tablet TAKE 1 TABLET BY MOUTH TWICE DAILY WITH MEAL. 60 tablet 2   glucose blood (ACCU-CHEK GUIDE) test strip USE AS DIRECTED TO CHECK BLOOD SUGAR UP TO 3 TIMES DAILY 100 strip 3   Insulin Syringe-Needle U-100 (BD INSULIN SYRINGE ULTRAFINE) 31G X 15/64" 0.5 ML MISC Use as directed twice daily. 300 each 3   lidocaine (XYLOCAINE) 2 % solution Use as directed 5 mLs in the mouth or throat every 6 (six) hours as needed for mouth pain. And spit out after 100 mL 0   lisinopril-hydrochlorothiazide (ZESTORETIC) 20-25 MG tablet TAKE 1 TABLET BY MOUTH DAILY 90 tablet 1   meloxicam (MOBIC) 7.5  MG tablet TAKE 1 TABLET(7.5 MG) BY MOUTH DAILY 30 tablet 1   metoprolol succinate (TOPROL-XL) 25 MG 24 hr tablet TAKE 1 TABLET(25 MG) BY MOUTH DAILY 90 tablet 1   Misc. Devices MISC Blood pressure monitor.  Diagnosis hypertension 1 each 0   Nutritional Supplements (GLUCERNA 1.0 CAL) LIQD Take 1 each by mouth 3 (three) times daily as needed. Strawberry or vanilla 1500 mL 1   ondansetron (ZOFRAN-ODT) 8 MG disintegrating tablet DISSOLVE 1/2 TO 1 TABLET ON THE TONGUE EVERY 8 HOURS AS NEEDED FOR NAUSEA OR VOMITING 20 tablet 0   insulin aspart (NOVOLOG FLEXPEN) 100 UNIT/ML FlexPen Inject 0-12 Units into the skin 3 (three) times daily with meals. As per sliding scale 30 mL 6   benzonatate (TESSALON) 100 MG capsule TAKE 1 CAPSULE(100 MG) BY MOUTH TWICE DAILY AS NEEDED FOR COUGH  (Patient not taking: Reported on 06/30/2021) 20 capsule 0   carboxymethylcellul-glycerin (REFRESH OPTIVE) 0.5-0.9 % ophthalmic solution Place 1 drop into both eyes as needed for dry eyes. (Patient not taking: Reported on 10/10/2021) 15 mL 2   dexamethasone 0.5 MG/5ML elixir Take 5 mLs (0.5 mg total) by mouth 3 (three) times daily. Swish and spit out. (Patient not taking: Reported on 10/10/2021) 237 mL 0   fluconazole (DIFLUCAN) 150 MG tablet Take 1 tablet (150 mg total) by mouth daily. (Patient not taking: Reported on 10/10/2021) 1 tablet 0   promethazine (PHENERGAN) 25 MG tablet Take 1 tablet (25 mg total) by mouth every 6 (six) hours as needed for nausea or vomiting. (Patient not taking: Reported on 06/30/2021) 30 tablet 0   insulin aspart protamine - aspart (NOVOLOG MIX 70/30 FLEXPEN) (70-30) 100 UNIT/ML FlexPen INJECT 13 UNITS UNDER THE SKIN IN THE MORNING AND 11 AT NIGHT (Patient not taking: Reported on 10/10/2021) 15 mL 6   No facility-administered medications prior to visit.     ROS Review of Systems  Constitutional:  Negative for activity change, appetite change and fatigue.  HENT:  Negative for congestion, sinus pressure and sore throat.   Eyes:  Negative for visual disturbance.  Respiratory:  Negative for cough, chest tightness, shortness of breath and wheezing.   Cardiovascular:  Negative for chest pain and palpitations.  Gastrointestinal:  Negative for abdominal distention, abdominal pain and constipation.  Endocrine: Negative for polydipsia.  Genitourinary:  Negative for dysuria and frequency.  Musculoskeletal:  Negative for arthralgias and back pain.  Skin:  Positive for color change. Negative for rash.  Neurological:  Negative for tremors, light-headedness and numbness.  Hematological:  Does not bruise/bleed easily.  Psychiatric/Behavioral:  Negative for agitation and behavioral problems.     Objective:  BP 114/71   Pulse (!) 106   Temp 99 F (37.2 C) (Oral)   Ht _0   (1.626 m)   Wt 128 lb 3.2 oz (58.2 kg)   SpO2 100%   BMI 22.01 kg/m      10/10/2021    4:14 PM 06/30/2021    2:12 PM 03/09/2021    8:42 AM  BP/Weight  Systolic BP 048 889 169  Diastolic BP 71 80 82  Wt. (Lbs) 128.2 126.8   BMI 22.01 kg/m2 21.77 kg/m2       Physical Exam Constitutional:      Appearance: She is well-developed.  Cardiovascular:     Rate and Rhythm: Tachycardia present.     Heart sounds: Normal heart sounds. No murmur heard. Pulmonary:     Effort: Pulmonary effort is normal.  Breath sounds: Normal breath sounds. No wheezing or rales.  Chest:     Chest wall: No tenderness.  Abdominal:     General: Bowel sounds are normal. There is no distension.     Palpations: Abdomen is soft. There is no mass.     Tenderness: There is no abdominal tenderness.  Musculoskeletal:        General: Normal range of motion.     Right lower leg: No edema.     Left lower leg: No edema.  Skin:    Comments: Vitiligo patches on entire body surface  Neurological:     Mental Status: She is alert and oriented to person, place, and time.  Psychiatric:        Mood and Affect: Mood normal.        Latest Ref Rng & Units 01/04/2021    3:34 PM 04/26/2020   10:02 AM 06/24/2019    3:13 PM  CMP  Glucose 70 - 99 mg/dL 101  100  195   BUN 6 - 20 mg/dL _0 Creatinine 0.57 - 1.00 mg/dL 0.77  0.80  0.70   Sodium 134 - 144 mmol/L 135  138  141   Potassium 3.5 - 5.2 mmol/L 5.0  4.4  4.4   Chloride 96 - 106 mmol/L 96  96  102   CO2 20 - 29 mmol/L _1 Calcium 8.7 - 10.2 mg/dL 10.2  10.3  9.5   Total Protein 6.0 - 8.5 g/dL  8.3  7.8   Total Bilirubin 0.0 - 1.2 mg/dL  <0.2  <0.2   Alkaline Phos 44 - 121 IU/L  99  96   AST 0 - 40 IU/L  25  22   ALT 0 - 32 IU/L  21  13     Lipid Panel     Component Value Date/Time   CHOL 149 03/24/2016 1120   TRIG 46 03/24/2016 1120   HDL 60 03/24/2016 1120   CHOLHDL 2.5 03/24/2016 1120   VLDL 9 03/24/2016 1120   LDLCALC 80  03/24/2016 1120    CBC    Component Value Date/Time   WBC 2.9 (L) 04/26/2020 1002   WBC 4.9 06/27/2015 1220   RBC 4.74 04/26/2020 1002   RBC 5.54 (H) 06/27/2015 1220   HGB 12.0 04/26/2020 1002   HCT 37.5 04/26/2020 1002   PLT 320 04/26/2020 1002   MCV 79 04/26/2020 1002   MCH 25.3 (L) 04/26/2020 1002   MCH 17.3 (L) 06/27/2015 1220   MCHC 32.0 04/26/2020 1002   MCHC 29.1 (L) 06/27/2015 1220   RDW 15.4 04/26/2020 1002   LYMPHSABS 1.2 04/26/2020 1002   MONOABS 0.4 06/27/2015 1220   EOSABS 0.1 04/26/2020 1002   BASOSABS 0.0 04/26/2020 1002    Lab Results  Component Value Date   HGBA1C 9.4 (A) 10/10/2021    Assessment & Plan:  1. Type 1 diabetes mellitus with hypoglycemia and without coma ([place]) Uncontrolled with A1c of 9.4; goal is less than 7.0 She is interested in OmniPod and I have referred her to endocrinology.  She will also benefit from CGM. Counseled on the importance of adhering to a diabetic diet Counseled on Diabetic diet, my plate method, 952 minutes of moderate intensity exercise/week Blood sugar logs with fasting goals of 80-120 mg/dl, random of less than 180 and in the event of sugars less than 60 mg/dl or greater than 400 mg/dl encouraged to notify  the clinic. Advised on the need for annual eye exams, annual foot exams, Pneumonia vaccine. - POCT glucose (manual entry) - POCT glycosylated hemoglobin (Hb A1C) - Microalbumin/Creatinine Ratio, Urine - CMP14+EGFR - insulin aspart protamine - aspart (NOVOLOG MIX 70/30 FLEXPEN) (70-30) 100 UNIT/ML FlexPen; INJECT 18 UNITS UNDER THE SKIN IN THE MORNING AND 15 AT NIGHT  Dispense: 15 mL; Refill: 3 - insulin aspart (NOVOLOG FLEXPEN) 100 UNIT/ML FlexPen; Inject 0-12 Units into the skin 3 (three) times daily with meals. As per sliding scale  Dispense: 30 mL; Refill: 3 - Ambulatory referral to Endocrinology  2. Hypertension associated with diabetes ([place]) Controlled Continue lisinopril/HCTZ Counseled on blood pressure goal  of less than 130/80, low-sodium, DASH diet, medication compliance, 150 minutes of moderate intensity exercise per week. Discussed medication compliance, adverse effects.    Meds ordered this encounter  Medications   insulin aspart protamine - aspart (NOVOLOG MIX 70/30 FLEXPEN) (70-30) 100 UNIT/ML FlexPen    Sig: INJECT 18 UNITS UNDER THE SKIN IN THE MORNING AND 15 AT NIGHT    Dispense:  15 mL    Refill:  3    Dose increase   insulin aspart (NOVOLOG FLEXPEN) 100 UNIT/ML FlexPen    Sig: Inject 0-12 Units into the skin 3 (three) times daily with meals. As per sliding scale    Dispense:  30 mL    Refill:  3    Follow-up: Return in about 3 months (around 01/10/2022) for Chronic medical conditions.       Charlott Rakes, MD, FAAFP. [facility] [place], [place]   10/10/2021, 4:56 PM

## 2021-10-11 LAB — CMP14+EGFR
ALT: 15 IU/L (ref 0–32)
AST: 22 IU/L (ref 0–40)
Albumin/Globulin Ratio: 1.6 (ref 1.2–2.2)
Albumin: 4.9 g/dL (ref 4.0–5.0)
Alkaline Phosphatase: 118 IU/L (ref 44–121)
BUN/Creatinine Ratio: 15 (ref 9–23)
BUN: 14 mg/dL (ref 6–20)
Bilirubin Total: 0.2 mg/dL (ref 0.0–1.2)
CO2: 24 mmol/L (ref 20–29)
Calcium: 9.9 mg/dL (ref 8.7–10.2)
Chloride: 97 mmol/L (ref 96–106)
Creatinine, Ser: 0.96 mg/dL (ref 0.57–1.00)
Globulin, Total: 3 g/dL (ref 1.5–4.5)
Glucose: 200 mg/dL — ABNORMAL HIGH (ref 70–99)
Potassium: 5 mmol/L (ref 3.5–5.2)
Sodium: 138 mmol/L (ref 134–144)
Total Protein: 7.9 g/dL (ref 6.0–8.5)
eGFR: 84 mL/min/{1.73_m2} (ref >59)

## 2021-10-11 LAB — MICROALBUMIN / CREATININE URINE RATIO
Creatinine, Urine: 116.8 mg/dL
Microalb/Creat Ratio: 10 mg/g creat (ref 0–29)
Microalbumin, Urine: 11.3 ug/mL

## 2021-10-28 ENCOUNTER — Other Ambulatory Visit: Payer: Self-pay | Admitting: Family Medicine

## 2021-10-28 NOTE — Telephone Encounter (Signed)
Requested medications are due for refill today.  yes  Requested medications are on the active medications list.  yes  Last refill. 07/11/2021 #30 1 refill  Future visit scheduled.   yes  Notes to clinic.  Expired labs.    Requested Prescriptions  Pending Prescriptions Disp Refills   meloxicam (MOBIC) 7.5 MG tablet [Pharmacy Med Name: MELOXICAM 7.$RemoveBeforeDEI'5MG'[id number]$  TABLETS] 30 tablet 1    Sig: TAKE 1 TABLET(7.5 MG) BY MOUTH DAILY     Analgesics:  COX2 Inhibitors Failed - 10/28/2021 10:24 AM      Failed - Manual Review: Labs are only required if the patient has taken medication for more than 8 weeks.      Failed - HGB in normal range and within 360 days    Hemoglobin  Date Value Ref Range Status  04/26/2020 12.0 11.1 - 15.9 g/dL Final         Failed - HCT in normal range and within 360 days    Hematocrit  Date Value Ref Range Status  04/26/2020 37.5 34.0 - 46.6 % Final         Passed - Cr in normal range and within 360 days    Creat  Date Value Ref Range Status  03/10/2016 0.72 0.50 - 1.10 mg/dL Final   Creatinine, Ser  Date Value Ref Range Status  10/10/2021 0.96 0.57 - 1.00 mg/dL Final   Creatinine, POC  Date Value Ref Range Status  12/04/2016 100 mg/dL Final         Passed - AST in normal range and within 360 days    AST  Date Value Ref Range Status  10/10/2021 22 0 - 40 IU/L Final         Passed - ALT in normal range and within 360 days    ALT  Date Value Ref Range Status  10/10/2021 15 0 - 32 IU/L Final         Passed - eGFR is 30 or above and within 360 days    GFR calc Af Amer  Date Value Ref Range Status  04/26/2020 119 >59 mL/min/1.73 Final    Comment:    **In accordance with recommendations from the NKF-ASN Task force,**   [facility] is in the process of updating its eGFR calculation to the   2021 CKD-EPI creatinine equation that estimates kidney function   without a race variable.    GFR calc non Af Amer  Date Value Ref Range Status  04/26/2020 104 >59  mL/min/1.73 Final   eGFR  Date Value Ref Range Status  10/10/2021 84 >59 mL/min/1.73 Final         Passed - Patient is not pregnant      Passed - Valid encounter within last 12 months    Recent Outpatient Visits           2 weeks ago Type 1 diabetes mellitus with hypoglycemia and without coma ([place])   [place], [place], MD   4 months ago Pap smear for cervical cancer screening   [place] Karle Plumber B, MD   9 months ago Type 1 diabetes mellitus with hypoglycemia and without coma [facility])   [place], Charlane Ferretti, MD   1 year ago Type 1 diabetes mellitus with hypoglycemia and without coma [facility])   [place], Charlane Ferretti, MD   2 years ago Type 1 diabetes mellitus without complication ([place])  [place], MD       Future Appointments             In 3 months Charlott Rakes, MD [place]

## 2021-12-15 ENCOUNTER — Encounter: Payer: Self-pay | Admitting: Family Medicine

## 2021-12-27 ENCOUNTER — Other Ambulatory Visit: Payer: Self-pay | Admitting: Family Medicine

## 2021-12-27 DIAGNOSIS — I10 Essential (primary) hypertension: Secondary | ICD-10-CM

## 2021-12-27 DIAGNOSIS — R000 Tachycardia, unspecified: Secondary | ICD-10-CM

## 2021-12-27 DIAGNOSIS — E109 Type 1 diabetes mellitus without complications: Secondary | ICD-10-CM

## 2022-01-15 ENCOUNTER — Other Ambulatory Visit: Payer: Self-pay | Admitting: Family Medicine

## 2022-01-23 ENCOUNTER — Other Ambulatory Visit: Payer: Self-pay | Admitting: Family Medicine

## 2022-01-27 ENCOUNTER — Encounter: Payer: Self-pay | Admitting: Family Medicine

## 2022-01-28 ENCOUNTER — Ambulatory Visit (HOSPITAL_COMMUNITY)
Admission: EM | Admit: 2022-01-28 | Discharge: 2022-01-28 | Disposition: A | Discharge status: Home / Self Care | Payer: Medicaid Other | Attending: Physician Assistant | Admitting: Physician Assistant

## 2022-01-28 ENCOUNTER — Encounter (HOSPITAL_COMMUNITY): Payer: Self-pay | Admitting: Emergency Medicine

## 2022-01-28 DIAGNOSIS — K029 Dental caries, unspecified: Secondary | ICD-10-CM

## 2022-01-28 MED ORDER — AMOXICILLIN-POT CLAVULANATE 875-125 MG PO TABS
1.0000 | ORAL_TABLET | Freq: Two times a day (BID) | ORAL | 0 refills | Status: DC
Start: 2022-01-28 — End: 2022-04-18

## 2022-01-28 MED ORDER — IBUPROFEN 600 MG PO TABS: 600.0000 mg | ORAL_TABLET | Freq: Four times a day (QID) | ORAL | 0 refills | Status: DC | PRN

## 2022-01-28 NOTE — Discharge Instructions (Signed)
Please take the Augmentin as directed.  You may alternate ibuprofen and Tylenol for pain.  Keep your diet soft until you are able to follow-up with your dentist.  Please follow-up sooner if any fever, swelling, severe pain develops.  OPTIONS FOR DENTAL FOLLOW UP CARE   [place] Department of Health and Human Services - Local Safety Net Dental Clinics [email or web address]  [place]: [employer] in [place] [phone], [facility] clinic in [place] [phone]  [place] county: [facility] [phone]   [facility] [phone])   [facility] [employer] practitioner in [place] [phone])  Discount dental care office with plans to help patient cover expenses. In addition, they also have walk - in emergency hours, patient can wait to be seen.   [facility] [place] [phone] ext [phone])   [facility] [phone])  This clinic caters to the indigent population and is on a lottery system.  Location:  [employer] [facility], [employer], [address], [place]  Clinic Hours:  Wednesdays from 6pm - 9pm, patients seen by a lottery system.  For dates, call or go to [email or web address]  Services:  Cleanings, fillings and simple extractions.  Payment Options:  DENTAL WORK IS FREE OF CHARGE. Bring proof of income or support.  Best way to get seen:  Arrive at 5:15 pm - this is a lottery, NOT first come/first serve, so arriving earlier will not increase your chances of being seen.   [facility]  [phone]  Select option 1 for emergencies  Location:  [facility], [place], [address] [place]  Clinic Hours:  No walk-ins accepted - call the day before to schedule an appointment.  Check in times are 9:30 am and 1:30 pm.  Services:  Simple extractions, temporary  fillings, pulpectomy/pulp debridement, uncomplicated abscess drainage.  Payment Options:  PAYMENT IS DUE AT THE TIME OF SERVICE.  Fee is usually $100-200, additional surgical procedures (e.g. abscess drainage) may be extra.  Cash, checks, Visa/MasterCard accepted.  Can file Medicaid if patient is covered for dental - patient should call case worker to check.  No discount for [facility] patients.  Best way to get seen:  MUST call the day before and get onto the schedule. Can usually be seen the next 1-2 days. No walk-ins accepted.   [facility]  [phone]    Location:  [facility], [address], [place]  Clinic Hours:  M, W, Th, F 8am or 1:30pm, Tues 9a or 1:30 - first come/first served.  Services:  Simple extractions, temporary fillings, uncomplicated abscess drainage.  You do not need to be an [facility] resident.  Payment Options:  PAYMENT IS DUE AT THE TIME OF SERVICE.  Dental insurance, otherwise sliding scale - bring proof of income or support.  Depending on income and treatment needed, cost is usually $50-200.  Best way to get seen:  Arrive early as it is first come/first served.   [facility]  [phone]    Location:  [address]  Clinic Hours:  Mon-Thu 8a-5p  Services:  Most basic dental services including extractions and fillings.  Payment Options:  PAYMENT IS DUE AT THE TIME OF SERVICE.  Sliding scale, up to 50% off - bring proof if income or support.  Medicaid with dental option accepted.  Best way to get seen:  Call to schedule an appointment, can usually  be seen within 2 weeks OR they will try to see walk-ins - show up at 8a or 2p (you may have to wait).   [facility]  [phone]  [place] RESIDENTS ONLY  Location:  [facility], [address] [place], [place] [address]  Clinic  Hours: By appointment only.  Monday - Thursday 8am-5pm, Friday 8am-12pm  Services: Cleanings, fillings, extractions.  Payment Options:  PAYMENT IS DUE AT THE TIME OF SERVICE.  Cash, Visa or MasterCard. Sliding scale - $30 minimum per service.  Best way to get seen:  Come in to office, complete packet and make an appointment - need proof of income  or support monies for each household member and proof of [facility] residence.  Usually takes about a month to get in.   [facility]  [phone]    Location:  [address], [facility]  Clinic Hours: Walk-in Urgent Care Dental Services are offered Monday-Friday mornings only.  The numbers of emergencies accepted daily is limited to the number of  providers available.  Maximum 15 - Mondays, Wednesdays & Thursdays  Maximum 10 - Tuesdays & Fridays  Services:  You do not need to be a [facility] resident to be seen for a dental emergency.  Emergencies are defined as pain, swelling, abnormal bleeding, or dental trauma. Walkins will receive x-rays if needed.  NOTE: Dental cleaning is not an emergency.  Payment Options:  PAYMENT IS DUE AT THE TIME OF SERVICE.  Minimum co-pay is $40.00 for uninsured patients.  Minimum co-pay is $3.00 for Medicaid with dental coverage.  Dental Insurance is accepted and must be presented at time of visit.  Medicare does not cover dental.  Forms of payment: Cash, credit card, checks.  Best way to get seen:  If not previously registered with the clinic, walk-in dental registration begins at 7:15 am and is on a first come/first serve basis.  If previously registered with the clinic, call to make an appointment.   [facility]  [phone]  [place] RESIDENTS ONLY  Location:  [address] [place], [place]  Clinic Hours:  Mon-Thu 10a-2p  Services: Extractions only!  Payment Options:  FREE (donations accepted) -  bring proof of income or support  Best way to get seen:  Call and schedule an appointment OR come at 8am on the 1st Monday of every month (except for holidays) when it is first come/first served.   [facility]  [phone]    Location:  [address] [place], [place]  Clinic Hours:  Friday mornings  Services, Payment Options, Best way to get seen:  Call for info

## 2022-01-28 NOTE — ED Triage Notes (Signed)
Pt reports left lower dental pain x 2 days. States tried taking Meloxicam for relief.

## 2022-01-28 NOTE — ED Provider Notes (Signed)
[place]   MRN: [id number] DOB: 1995/10/26  Subjective:   Morgan Moon is a 26 y.o. female presenting for left lower dental pain for the last 2 to 3 days.  Patient states that part of her tooth cracked and now she is having pain.  She denies any injury to her jaw.  She denies any fever or chills.  No pus or bleeding in her mouth.  She has a history of dental issues.  Meloxicam has not been helping with the pain.  No current facility-administered medications for this encounter.  Current Outpatient Medications:    amoxicillin-clavulanate (AUGMENTIN) 875-125 MG tablet, Take 1 tablet by mouth every 12 (twelve) hours. Take with food., Disp: 14 tablet, Rfl: 0   ibuprofen (ADVIL) 600 MG tablet, Take 1 tablet (600 mg total) by mouth every 6 (six) hours as needed. Take with food., Disp: 30 tablet, Rfl: 0   Accu-Chek FastClix Lancets MISC, USE TO TEST UP TO THREE TIMES DAILY AS DIRECTED, Disp: 102 each, Rfl: 0   BD PEN NEEDLE NANO 2ND GEN 32G X 4 MM MISC, USE TO INJECT INSULIN TWICE DAILY, Disp: 100 each, Rfl: 2   benzonatate (TESSALON) 100 MG capsule, TAKE 1 CAPSULE(100 MG) BY MOUTH TWICE DAILY AS NEEDED FOR COUGH, Disp: 20 capsule, Rfl: 0   Blood Glucose Monitoring Suppl (ACCU-CHEK GUIDE ME) w/Device KIT, 1 kit by Does not apply route daily., Disp: 1 kit, Rfl: 0   carboxymethylcellul-glycerin (REFRESH OPTIVE) 0.5-0.9 % ophthalmic solution, Place 1 drop into both eyes as needed for dry eyes. (Patient not taking: Reported on 10/10/2021), Disp: 15 mL, Rfl: 2   dexamethasone 0.5 MG/5ML elixir, Take 5 mLs (0.5 mg total) by mouth 3 (three) times daily. Swish and spit out. (Patient not taking: Reported on 10/10/2021), Disp: 237 mL, Rfl: 0   FEROSUL 325 (65 Fe) MG tablet, TAKE 1 TABLET BY MOUTH TWICE DAILY WITH MEAL., Disp: 60 tablet, Rfl: 2   fluconazole (DIFLUCAN) 150 MG tablet, Take 1 tablet (150 mg total) by mouth daily. (Patient not taking: Reported on 10/10/2021), Disp: 1 tablet,  Rfl: 0   glucose blood (ACCU-CHEK GUIDE) test strip, TEST BLOOD SUGAR UP TO THREE TIMES DAILY AS DIRECTED, Disp: 100 strip, Rfl: 0   insulin aspart (NOVOLOG FLEXPEN) 100 UNIT/ML FlexPen, Inject 0-12 Units into the skin 3 (three) times daily with meals. As per sliding scale, Disp: 30 mL, Rfl: 3   insulin aspart protamine - aspart (NOVOLOG MIX 70/30 FLEXPEN) (70-30) 100 UNIT/ML FlexPen, INJECT 18 UNITS UNDER THE SKIN IN THE MORNING AND 15 AT NIGHT, Disp: 15 mL, Rfl: 3   Insulin Syringe-Needle U-100 (BD INSULIN SYRINGE ULTRAFINE) 31G X 15/64" 0.5 ML MISC, Use as directed twice daily., Disp: 300 each, Rfl: 3   lidocaine (XYLOCAINE) 2 % solution, Use as directed 5 mLs in the mouth or throat every 6 (six) hours as needed for mouth pain. And spit out after, Disp: 100 mL, Rfl: 0   lisinopril-hydrochlorothiazide (ZESTORETIC) 20-25 MG tablet, TAKE 1 TABLET BY MOUTH DAILY, Disp: 90 tablet, Rfl: 0   meloxicam (MOBIC) 7.5 MG tablet, TAKE 1 TABLET(7.5 MG) BY MOUTH DAILY, Disp: 30 tablet, Rfl: 1   metoprolol succinate (TOPROL-XL) 25 MG 24 hr tablet, TAKE 1 TABLET(25 MG) BY MOUTH DAILY, Disp: 90 tablet, Rfl: 0   Misc. Devices MISC, Blood pressure monitor.  Diagnosis hypertension, Disp: 1 each, Rfl: 0   Nutritional Supplements (GLUCERNA 1.0 CAL) LIQD, Take 1 each by mouth 3 (  three) times daily as needed. Strawberry or vanilla, Disp: 1500 mL, Rfl: 1   ondansetron (ZOFRAN-ODT) 4 MG disintegrating tablet, DISSOLVE 1 TABLET(4 MG) ON THE TONGUE EVERY 8 HOURS AS NEEDED FOR NAUSEA OR VOMITING, Disp: 30 tablet, Rfl: 1   promethazine (PHENERGAN) 25 MG tablet, Take 1 tablet (25 mg total) by mouth every 6 (six) hours as needed for nausea or vomiting. (Patient not taking: Reported on 06/30/2021), Disp: 30 tablet, Rfl: 0   Allergies  Allergen Reactions   Amlodipine Other (See Comments)    headaches    Past Medical History:  Diagnosis Date   Diabetes mellitus without complication ([place])    Hypertension      History  reviewed. No pertinent surgical history.  Family History  Problem Relation Age of Onset   Hypertension Maternal Aunt    Hypertension Maternal Uncle    Hypertension Maternal Grandmother    Hypertension Maternal Grandfather     Social History   Tobacco Use   Smoking status: Never   Smokeless tobacco: Never  Substance Use Topics   Alcohol use: No   Drug use: No    ROS REFER TO HPI FOR PERTINENT POSITIVES AND NEGATIVES   Objective:   Vitals: BP 136/79 (BP Location: Right Arm)   Pulse (!) 101   Temp 99.9 F (37.7 C) (Oral)   Resp 18   SpO2 95%   Physical Exam Vitals and nursing note reviewed.  Constitutional:      General: She is not in acute distress.    Appearance: Normal appearance. She is not ill-appearing or diaphoretic.  HENT:     Mouth/Throat:   Neurological:     Mental Status: She is alert and oriented to person, place, and time.  Psychiatric:        Mood and Affect: Mood normal.        Behavior: Behavior normal.     No results found for this or any previous visit (from the past 24 hour(s)).  Assessment and Plan :   I have reviewed the PDMP during this encounter.  1. Dental caries    Immunocompetent patient with dental disease.  New dental caries with pain.  Plan to start on Augmentin 875-125 mg twice daily x7 days with food.  She can alternate Tylenol and ibuprofen 600 mg with food.  Do not take any other NSAIDs with this ibuprofen.  Soft foods advised.  Ultimately she needs to follow-up with her dentist.  ER precautions advised.  Patient understanding and agreeable.  Stable for discharge.    AllwardtRanda Evens, PA-C 01/28/22 1244

## 2022-01-30 ENCOUNTER — Ambulatory Visit: Payer: Medicaid Other | Admitting: Family Medicine

## 2022-02-16 ENCOUNTER — Other Ambulatory Visit: Payer: Self-pay | Admitting: Internal Medicine

## 2022-02-17 NOTE — Telephone Encounter (Signed)
Requested Prescriptions  Pending Prescriptions Disp Refills   BD PEN NEEDLE NANO 2ND GEN 32G X 4 MM MISC [Pharmacy Med Name: B-D NANO 2ND GEN PEN NDL 32GX4MMGRN] 100 each 0    Sig: USE TO INJECT INSULIN TWICE DAILY     Endocrinology: Diabetes - Testing Supplies Passed - 02/16/2022  6:39 PM      Passed - Valid encounter within last 12 months    Recent Outpatient Visits           4 months ago Type 1 diabetes mellitus with hypoglycemia and without coma (HCC)   [facility] [place], Odette Horns, MD   7 months ago Pap smear for cervical cancer screening   [place] [facility] [facility] Jonah Blue B, MD   1 year ago Type 1 diabetes mellitus with hypoglycemia and without coma (HCC)   [facility] [place], Odette Horns, MD   1 year ago Type 1 diabetes mellitus with hypoglycemia and without coma (HCC)   [facility] [place], Odette Horns, MD   2 years ago Type 1 diabetes mellitus without complication [facility])   [facility] Hoy Register, MD       Future Appointments             In 2 months Hoy Register, MD [facility]   In 4 months Shamleffer, Konrad Dolores, MD [facility] Endocrinology

## 2022-02-18 ENCOUNTER — Other Ambulatory Visit: Payer: Self-pay | Admitting: Family Medicine

## 2022-02-20 NOTE — Telephone Encounter (Signed)
Requested Prescriptions  Pending Prescriptions Disp Refills   ACCU-CHEK GUIDE test strip [Pharmacy Med Name: ACCU-CHEK GUIDE TEST STRIPS 100S] 100 strip 0    Sig: TEST BLOOD SUGAR UP TO THREE TIMES DAILY AS DIRECTED     Endocrinology: Diabetes - Testing Supplies Passed - 02/18/2022  1:36 PM      Passed - Valid encounter within last 12 months    Recent Outpatient Visits           4 months ago Type 1 diabetes mellitus with hypoglycemia and without coma (HCC)   [place] [facility] [place], Odette Horns, MD   7 months ago Pap smear for cervical cancer screening   [place] [facility] Jonah Blue B, MD   1 year ago Type 1 diabetes mellitus with hypoglycemia and without coma (HCC)   [facility] [place], Odette Horns, MD   1 year ago Type 1 diabetes mellitus with hypoglycemia and without coma (HCC)   [facility] [place], Odette Horns, MD   2 years ago Type 1 diabetes mellitus without complication [facility])   [facility] Hoy Register, MD       Future Appointments             In 1 month Hoy Register, MD [facility]   In 4 months Shamleffer, Konrad Dolores, MD [facility] Endocrinology

## 2022-03-15 ENCOUNTER — Other Ambulatory Visit: Payer: Self-pay | Admitting: Family Medicine

## 2022-03-15 ENCOUNTER — Encounter: Payer: Self-pay | Admitting: Family Medicine

## 2022-03-15 DIAGNOSIS — E10649 Type 1 diabetes mellitus with hypoglycemia without coma: Secondary | ICD-10-CM

## 2022-03-15 MED ORDER — NOVOLOG MIX 70/30 FLEXPEN (70-30) 100 UNIT/ML ~~LOC~~ SUPN: PEN_INJECTOR | SUBCUTANEOUS | 3 refills | Status: DC

## 2022-03-25 ENCOUNTER — Other Ambulatory Visit: Payer: Self-pay | Admitting: Family Medicine

## 2022-03-25 DIAGNOSIS — R000 Tachycardia, unspecified: Secondary | ICD-10-CM

## 2022-03-25 DIAGNOSIS — I10 Essential (primary) hypertension: Secondary | ICD-10-CM

## 2022-03-26 ENCOUNTER — Other Ambulatory Visit: Payer: Self-pay | Admitting: Family Medicine

## 2022-03-27 NOTE — Telephone Encounter (Signed)
Requested Prescriptions  Pending Prescriptions Disp Refills   lisinopril-hydrochlorothiazide (ZESTORETIC) 20-25 MG tablet [Pharmacy Med Name: LISINOPRIL-HCTZ 20/25MG  TABLETS] 90 tablet 0    Sig: TAKE 1 TABLET BY MOUTH DAILY     Cardiovascular:  ACEI + Diuretic Combos Passed - 03/25/2022 11:25 AM      Passed - Na in normal range and within 180 days    Sodium  Date Value Ref Range Status  10/10/2021 138 134 - 144 mmol/L Final         Passed - K in normal range and within 180 days    Potassium  Date Value Ref Range Status  10/10/2021 5.0 3.5 - 5.2 mmol/L Final         Passed - Cr in normal range and within 180 days    Creat  Date Value Ref Range Status  03/10/2016 0.72 0.50 - 1.10 mg/dL Final   Creatinine, Ser  Date Value Ref Range Status  10/10/2021 0.96 0.57 - 1.00 mg/dL Final   Creatinine, POC  Date Value Ref Range Status  12/04/2016 100 mg/dL Final         Passed - eGFR is 30 or above and within 180 days    GFR calc Af Amer  Date Value Ref Range Status  04/26/2020 119 >59 mL/min/1.73 Final    Comment:    **In accordance with recommendations from the NKF-ASN Task force,**   [facility] is in the process of updating its eGFR calculation to the   2021 CKD-EPI creatinine equation that estimates kidney function   without a race variable.    GFR calc non Af Amer  Date Value Ref Range Status  04/26/2020 104 >59 mL/min/1.73 Final   eGFR  Date Value Ref Range Status  10/10/2021 84 >59 mL/min/1.73 Final         Passed - Patient is not pregnant      Passed - Last BP in normal range    BP Readings from Last 1 Encounters:  01/28/22 136/79         Passed - Valid encounter within last 6 months    Recent Outpatient Visits           5 months ago Type 1 diabetes mellitus with hypoglycemia and without coma (HCC)   [place] [facility] [place], Odette Horns, MD   9 months ago Pap smear for cervical cancer screening   [facility]  Jonah Blue B, MD   1 year ago Type 1 diabetes mellitus with hypoglycemia and without coma (HCC)   [place] [facility] [place], Odette Horns, MD   1 year ago Type 1 diabetes mellitus with hypoglycemia and without coma (HCC)   [place] [facility] [place], Odette Horns, MD   2 years ago Type 1 diabetes mellitus without complication [facility])   [place] Hoy Register, MD       Future Appointments             In 3 weeks Hoy Register, MD [facility]   In 2 months Shamleffer, Konrad Dolores, MD [facility]             metoprolol succinate (TOPROL-XL) 25 MG 24 hr tablet [Pharmacy Med Name: METOPROLOL ER SUCCINATE 25MG  TABS] 90 tablet 0    Sig: TAKE 1 TABLET(25 MG) BY MOUTH DAILY     Cardiovascular:  Beta Blockers Passed - 03/25/2022 11:25 AM  Passed - Last BP in normal range    BP Readings from Last 1 Encounters:  01/28/22 136/79         Passed - Last Heart Rate in normal range    Pulse Readings from Last 1 Encounters:  01/28/22 (!) 101         Passed - Valid encounter within last 6 months    Recent Outpatient Visits           5 months ago Type 1 diabetes mellitus with hypoglycemia and without coma ([place])   [place], [place], MD   9 months ago Pap smear for cervical cancer screening   [place] Karle Plumber B, MD   1 year ago Type 1 diabetes mellitus with hypoglycemia and without coma ([place])   [place], Charlane Ferretti, MD   1 year ago Type 1 diabetes mellitus with hypoglycemia and without coma ([place])   [place], Charlane Ferretti, MD   2 years ago Type 1 diabetes mellitus without complication [facility])   [place] Charlott Rakes, MD       Future Appointments             In 3 weeks Charlott Rakes, MD [place]   In 2 months Shamleffer, Melanie Crazier, MD [facility] Endocrinology

## 2022-03-27 NOTE — Telephone Encounter (Signed)
Requested Prescriptions  Pending Prescriptions Disp Refills   glucose blood (ACCU-CHEK GUIDE) test strip [Pharmacy Med Name: ACCU-CHEK GUIDE TEST STRIPS 100S] 100 strip 2    Sig: TEST BLOOD SUGAR UP TO THREE TIMES DAILY AS NEEDED     Endocrinology: Diabetes - Testing Supplies Passed - 03/26/2022 11:15 AM      Passed - Valid encounter within last 12 months    Recent Outpatient Visits           5 months ago Type 1 diabetes mellitus with hypoglycemia and without coma ([place])   [place], Charlane Ferretti, MD   9 months ago Pap smear for cervical cancer screening   [place] Karle Plumber B, MD   1 year ago Type 1 diabetes mellitus with hypoglycemia and without coma ([place])   [place], Charlane Ferretti, MD   1 year ago Type 1 diabetes mellitus with hypoglycemia and without coma ([place])   [place], Charlane Ferretti, MD   2 years ago Type 1 diabetes mellitus without complication [facility])   [place] Charlott Rakes, MD       Future Appointments             In 3 weeks Charlott Rakes, MD [place]   In 2 months Shamleffer, Melanie Crazier, MD [facility] Endocrinology

## 2022-03-29 ENCOUNTER — Other Ambulatory Visit: Payer: Self-pay | Admitting: Family Medicine

## 2022-03-29 NOTE — Telephone Encounter (Signed)
Requested medication (s) are due for refill today: yes   Requested medication (s) are on the active medication list: yes   Last refill:  01/23/22 #20 0 refills  Future visit scheduled: yes in 2 weeks   Notes to clinic:  no refills remain. Do you want to refill Rx?     Requested Prescriptions  Pending Prescriptions Disp Refills   benzonatate (TESSALON) 100 MG capsule [Pharmacy Med Name: BENZONATATE 100MG  CAPSULES] 20 capsule 0    Sig: TAKE 1 CAPSULE(100 MG) BY MOUTH TWICE DAILY AS NEEDED FOR COUGH     Ear, Nose, and Throat:  Antitussives/Expectorants Passed - 03/29/2022 11:16 AM      Passed - Valid encounter within last 12 months    Recent Outpatient Visits           5 months ago Type 1 diabetes mellitus with hypoglycemia and without coma ([place])   [place], [place], MD   9 months ago Pap smear for cervical cancer screening   [place] Karle Plumber B, MD   1 year ago Type 1 diabetes mellitus with hypoglycemia and without coma ([place])   [place], Charlane Ferretti, MD   1 year ago Type 1 diabetes mellitus with hypoglycemia and without coma ([place])   [place], Charlane Ferretti, MD   2 years ago Type 1 diabetes mellitus without complication [facility])   [place] Charlott Rakes, MD       Future Appointments             In 2 weeks Charlott Rakes, MD [place]   In 2 months Shamleffer, Melanie Crazier, MD [facility] Endocrinology

## 2022-04-18 ENCOUNTER — Ambulatory Visit: Payer: Medicaid Other | Attending: Family Medicine | Admitting: Family Medicine

## 2022-04-18 ENCOUNTER — Encounter: Payer: Self-pay | Admitting: Family Medicine

## 2022-04-18 ENCOUNTER — Telehealth: Payer: Self-pay | Admitting: Family Medicine

## 2022-04-18 VITALS — BP 132/84 | HR 83 | Temp 98.2°F | Ht 64.0 in | Wt 129.4 lb

## 2022-04-18 DIAGNOSIS — R000 Tachycardia, unspecified: Secondary | ICD-10-CM | POA: Diagnosis not present

## 2022-04-18 DIAGNOSIS — E10649 Type 1 diabetes mellitus with hypoglycemia without coma: Secondary | ICD-10-CM

## 2022-04-18 DIAGNOSIS — I10 Essential (primary) hypertension: Secondary | ICD-10-CM

## 2022-04-18 LAB — POCT GLYCOSYLATED HEMOGLOBIN (HGB A1C): HbA1c, POC (controlled diabetic range): 7 % (ref 0.0–7.0)

## 2022-04-18 MED ORDER — NOVOLOG MIX 70/30 FLEXPEN (70-30) 100 UNIT/ML ~~LOC~~ SUPN: PEN_INJECTOR | SUBCUTANEOUS | 6 refills | Status: DC

## 2022-04-18 MED ORDER — NOVOLOG FLEXPEN 100 UNIT/ML ~~LOC~~ SOPN: 0.0000 [IU] | PEN_INJECTOR | Freq: Three times a day (TID) | SUBCUTANEOUS | 6 refills | Status: DC

## 2022-04-18 MED ORDER — MISC. DEVICES MISC: 0 refills | Status: AC

## 2022-04-18 MED ORDER — FREESTYLE LIBRE 2 SENSOR MISC: 3 refills | Status: AC

## 2022-04-18 MED ORDER — LISINOPRIL-HYDROCHLOROTHIAZIDE 20-25 MG PO TABS: 1.0000 | ORAL_TABLET | Freq: Every day | ORAL | 1 refills | Status: DC

## 2022-04-18 MED ORDER — METOPROLOL SUCCINATE ER 25 MG PO TB24: ORAL_TABLET | ORAL | 1 refills | Status: DC

## 2022-04-18 MED ORDER — FREESTYLE LIBRE 2 READER DEVI: 0 refills | Status: AC

## 2022-04-18 NOTE — Progress Notes (Signed)
Subjective:  Patient ID: Morgan Moon, female    DOB: Feb 12, 1996  Age: 27 y.o. MRN: [id number]  CC: Diabetes   HPI Morgan Moon is a 27 y.o. year old female with a history of type 1 diabetes mellitus (A1c 7.0), hypertension, vitiligo, learning disability.   Interval History:  She presents today for an office visit and endorses adherence with her antihypertensives.  She had called complaining of elevated sugars and her insulin regimen was adjusted.  Endorses previously not adhering with a diabetic diet but has begun to do better over recent.  She does not exercise much. Doing well on her antihypertensive with no adverse effects from her medications. She would like a new prescription for a blood pressure monitor as she never picked up the one which was previously prescribed for her. Past Medical History:  Diagnosis Date   Diabetes mellitus without complication ([place])    Hypertension     No past surgical history on file.  Family History  Problem Relation Age of Onset   Hypertension Maternal Aunt    Hypertension Maternal Uncle    Hypertension Maternal Grandmother    Hypertension Maternal Grandfather     Social History   Socioeconomic History   Marital status: Single    Spouse name: Not on file   Number of children: Not on file   Years of education: Not on file   Highest education level: Not on file  Occupational History   Not on file  Tobacco Use   Smoking status: Never   Smokeless tobacco: Never  Substance and Sexual Activity   Alcohol use: No   Drug use: No   Sexual activity: Not on file  Other Topics Concern   Not on file  Social History Narrative   Not on file   Social Determinants of Health   Financial Resource Strain: Not on file  Food Insecurity: Not on file  Transportation Needs: Not on file  Physical Activity: Not on file  Stress: Not on file  Social Connections: Not on file    Allergies  Allergen Reactions   Amlodipine Other (See Comments)     headaches    Outpatient Medications Prior to Visit  Medication Sig Dispense Refill   Accu-Chek FastClix Lancets MISC USE TO TEST UP TO THREE TIMES DAILY AS DIRECTED 102 each 0   Blood Glucose Monitoring Suppl (ACCU-CHEK GUIDE ME) w/Device KIT 1 kit by Does not apply route daily. 1 kit 0   FEROSUL 325 (65 Fe) MG tablet TAKE 1 TABLET BY MOUTH TWICE DAILY WITH MEAL. 60 tablet 2   glucose blood (ACCU-CHEK GUIDE) test strip TEST BLOOD SUGAR UP TO THREE TIMES DAILY AS NEEDED 100 strip 2   ibuprofen (ADVIL) 600 MG tablet Take 1 tablet (600 mg total) by mouth every 6 (six) hours as needed. Take with food. 30 tablet 0   Insulin Pen Needle (BD PEN NEEDLE NANO 2ND GEN) 32G X 4 MM MISC USE TO INJECT INSULIN TWICE DAILY 100 each 0   Insulin Syringe-Needle U-100 (BD INSULIN SYRINGE ULTRAFINE) 31G X 15/64" 0.5 ML MISC Use as directed twice daily. 300 each 3   lidocaine (XYLOCAINE) 2 % solution Use as directed 5 mLs in the mouth or throat every 6 (six) hours as needed for mouth pain. And spit out after 100 mL 0   meloxicam (MOBIC) 7.5 MG tablet TAKE 1 TABLET(7.5 MG) BY MOUTH DAILY 30 tablet 1   Nutritional Supplements (GLUCERNA 1.0 CAL) LIQD Take 1 each  by mouth 3 (three) times daily as needed. Strawberry or vanilla 1500 mL 1   ondansetron (ZOFRAN-ODT) 4 MG disintegrating tablet DISSOLVE 1 TABLET(4 MG) ON THE TONGUE EVERY 8 HOURS AS NEEDED FOR NAUSEA OR VOMITING 30 tablet 1   insulin aspart (NOVOLOG FLEXPEN) 100 UNIT/ML FlexPen Inject 0-12 Units into the skin 3 (three) times daily with meals. As per sliding scale 30 mL 3   insulin aspart protamine - aspart (NOVOLOG MIX 70/30 FLEXPEN) (70-30) 100 UNIT/ML FlexPen INJECT 23 UNITS UNDER THE SKIN IN THE MORNING AND 20 AT NIGHT 15 mL 3   lisinopril-hydrochlorothiazide (ZESTORETIC) 20-25 MG tablet TAKE 1 TABLET BY MOUTH DAILY 90 tablet 0   metoprolol succinate (TOPROL-XL) 25 MG 24 hr tablet TAKE 1 TABLET(25 MG) BY MOUTH DAILY 90 tablet 0   Misc. Devices MISC Blood  pressure monitor.  Diagnosis hypertension 1 each 0   amoxicillin-clavulanate (AUGMENTIN) 875-125 MG tablet Take 1 tablet by mouth every 12 (twelve) hours. Take with food. (Patient not taking: Reported on 04/18/2022) 14 tablet 0   benzonatate (TESSALON) 100 MG capsule TAKE 1 CAPSULE(100 MG) BY MOUTH TWICE DAILY AS NEEDED FOR COUGH (Patient not taking: Reported on 04/18/2022) 20 capsule 0   carboxymethylcellul-glycerin (REFRESH OPTIVE) 0.5-0.9 % ophthalmic solution Place 1 drop into both eyes as needed for dry eyes. (Patient not taking: Reported on 10/10/2021) 15 mL 2   dexamethasone 0.5 MG/5ML elixir Take 5 mLs (0.5 mg total) by mouth 3 (three) times daily. Swish and spit out. (Patient not taking: Reported on 10/10/2021) 237 mL 0   fluconazole (DIFLUCAN) 150 MG tablet Take 1 tablet (150 mg total) by mouth daily. (Patient not taking: Reported on 10/10/2021) 1 tablet 0   promethazine (PHENERGAN) 25 MG tablet Take 1 tablet (25 mg total) by mouth every 6 (six) hours as needed for nausea or vomiting. (Patient not taking: Reported on 06/30/2021) 30 tablet 0   No facility-administered medications prior to visit.     ROS Review of Systems  Constitutional:  Negative for activity change and appetite change.  HENT:  Negative for sinus pressure and sore throat.   Respiratory:  Negative for chest tightness, shortness of breath and wheezing.   Cardiovascular:  Negative for chest pain and palpitations.  Gastrointestinal:  Negative for abdominal distention, abdominal pain and constipation.  Genitourinary: Negative.   Musculoskeletal: Negative.   Psychiatric/Behavioral:  Negative for behavioral problems and dysphoric mood.     Objective:  BP 132/84   Pulse 83   Temp 98.2 F (36.8 C) (Oral)   Ht 5\' 4"  (1.626 m)   Wt 129 lb 6.4 oz (58.7 kg)   SpO2 100%   BMI 22.21 kg/m      04/18/2022    3:47 PM 01/28/2022   11:42 AM 10/10/2021    4:14 PM  BP/Weight  Systolic BP 660 630 160  Diastolic BP 84 79 71  Wt.  (Lbs) 129.4  128.2  BMI 22.21 kg/m2  22.01 kg/m2      Physical Exam Constitutional:      Appearance: She is well-developed.  Cardiovascular:     Rate and Rhythm: Normal rate.     Heart sounds: Normal heart sounds. No murmur heard. Pulmonary:     Effort: Pulmonary effort is normal.     Breath sounds: Normal breath sounds. No wheezing or rales.  Chest:     Chest wall: No tenderness.  Abdominal:     General: Bowel sounds are normal. There is no distension.  Palpations: Abdomen is soft. There is no mass.     Tenderness: There is no abdominal tenderness.  Musculoskeletal:        General: Normal range of motion.     Right lower leg: No edema.     Left lower leg: No edema.  Skin:    Comments: Hypopigmented lesions on different body parts  Neurological:     Mental Status: She is alert and oriented to person, place, and time.  Psychiatric:        Mood and Affect: Mood normal.    Diabetic Foot Exam - Simple   Simple Foot Form Visual Inspection No deformities, no ulcerations, no other skin breakdown bilaterally: Yes Sensation Testing Intact to touch and monofilament testing bilaterally: Yes Pulse Check Posterior Tibialis and Dorsalis pulse intact bilaterally: Yes Comments        Latest Ref Rng & Units 10/10/2021    4:45 PM 01/04/2021    3:34 PM 04/26/2020   10:02 AM  CMP  Glucose 70 - 99 mg/dL 016  010  932   BUN 6 - 20 mg/dL 14  14  9    Creatinine 0.57 - 1.00 mg/dL  3.55  7.32   Sodium 134 - 144 mmol/L 138  135  138   Potassium 3.5 - 5.2 mmol/L 5.0  5.0  4.4   Chloride 96 - 106 mmol/L 97  96  96   CO2 20 - 29 mmol/L 24  24  24    Calcium 8.7 - 10.2 mg/dL 9.9  2.02    Total Protein 6.0 - 8.5 g/dL 7.9   8.3   Total Bilirubin 0.0 - 1.2 mg/dL 0.2   54.2   Alkaline Phos 44 - 121 IU/L 118   99   AST 0 - 40 IU/L 22   25   ALT 0 - 32 IU/L 15   21     Lipid Panel     Component Value Date/Time   CHOL 149 03/24/2016 1120   TRIG 46 03/24/2016 1120   HDL 60  03/24/2016 1120   CHOLHDL 2.5 03/24/2016 1120   VLDL 9 03/24/2016 1120   LDLCALC 80 03/24/2016 1120    CBC    Component Value Date/Time   WBC 2.9 (L) 04/26/2020 1002   WBC 4.9 06/27/2015 1220   RBC 4.74 04/26/2020 1002   RBC 5.54 (H) 06/27/2015 1220   HGB 12.0 04/26/2020 1002   HCT 37.5 04/26/2020 1002   PLT 320 04/26/2020 1002   MCV 79 04/26/2020 1002   MCH 25.3 (L) 04/26/2020 1002   MCH 17.3 (L) 06/27/2015 1220   MCHC 32.0 04/26/2020 1002   MCHC 29.1 (L) 06/27/2015 1220   RDW 15.4 04/26/2020 1002   LYMPHSABS 1.2 04/26/2020 1002   MONOABS 0.4 06/27/2015 1220   EOSABS 0.1 04/26/2020 1002   BASOSABS 0.0 04/26/2020 1002    Lab Results  Component Value Date   HGBA1C 7.0 04/18/2022    Assessment & Plan:  1. Type 1 diabetes mellitus with hypoglycemia and without coma (HCC) Uncontrolled with A1c of 7.0 Continue current regimen Will send prescription for CGM to her pharmacy Counseled on Diabetic diet, my plate method, 06/24/2020 minutes of moderate intensity exercise/week Blood sugar logs with fasting goals of 80-120 mg/dl, random of less than 04/20/2022 and in the event of sugars less than 60 mg/dl or greater than 762 mg/dl encouraged to notify the clinic. Advised on the need for annual eye exams, annual foot exams, Pneumonia vaccine. -  POCT glycosylated hemoglobin (Hb A1C) - Microalbumin / creatinine urine ratio - CMP14+EGFR - insulin aspart (NOVOLOG FLEXPEN) 100 UNIT/ML FlexPen; Inject 0-12 Units into the skin 3 (three) times daily with meals. As per sliding scale  Dispense: 30 mL; Refill: 6 - insulin aspart protamine - aspart (NOVOLOG MIX 70/30 FLEXPEN) (70-30) 100 UNIT/ML FlexPen; INJECT 23 UNITS UNDER THE SKIN IN THE MORNING AND 20 AT NIGHT  Dispense: 15 mL; Refill: 6  2. Essential hypertension Controlled Continue current regimen Counseled on blood pressure goal of less than 130/80, low-sodium, DASH diet, medication compliance, 150 minutes of moderate intensity exercise per  week. Discussed medication compliance, adverse effects. - lisinopril-hydrochlorothiazide (ZESTORETIC) 20-25 MG tablet; Take 1 tablet by mouth daily.  Dispense: 90 tablet; Refill: 1 - Misc. Devices MISC; Blood pressure monitor.  Diagnosis hypertension  Dispense: 1 each; Refill: 0  3. Tachycardia Controlled - metoprolol succinate (TOPROL-XL) 25 MG 24 hr tablet; TAKE 1 TABLET(25 MG) BY MOUTH DAILY  Dispense: 90 tablet; Refill: 1   Meds ordered this encounter  Medications   insulin aspart (NOVOLOG FLEXPEN) 100 UNIT/ML FlexPen    Sig: Inject 0-12 Units into the skin 3 (three) times daily with meals. As per sliding scale    Dispense:  30 mL    Refill:  6   insulin aspart protamine - aspart (NOVOLOG MIX 70/30 FLEXPEN) (70-30) 100 UNIT/ML FlexPen    Sig: INJECT 23 UNITS UNDER THE SKIN IN THE MORNING AND 20 AT NIGHT    Dispense:  15 mL    Refill:  6   lisinopril-hydrochlorothiazide (ZESTORETIC) 20-25 MG tablet    Sig: Take 1 tablet by mouth daily.    Dispense:  90 tablet    Refill:  1   metoprolol succinate (TOPROL-XL) 25 MG 24 hr tablet    Sig: TAKE 1 TABLET(25 MG) BY MOUTH DAILY    Dispense:  90 tablet    Refill:  1   Misc. Devices MISC    Sig: Blood pressure monitor.  Diagnosis hypertension    Dispense:  1 each    Refill:  0    Follow-up: Return in about 6 months (around 10/17/2022) for Chronic medical conditions.       Charlott Rakes, MD, FAAFP. [facility] [place], [place]   04/18/2022, 4:35 PM

## 2022-04-18 NOTE — Telephone Encounter (Signed)
Patient with type I DM on multiple insulin doses. Please send prescription for CGM covered by her insurance.  Thank you

## 2022-04-18 NOTE — Telephone Encounter (Signed)
Yes ma'am, done. Rx may still need a PA but looks to be covered.

## 2022-04-18 NOTE — Patient Instructions (Signed)

## 2022-04-20 LAB — CMP14+EGFR
ALT: 11 IU/L (ref 0–32)
AST: 17 IU/L (ref 0–40)
Albumin/Globulin Ratio: 1.6 (ref 1.2–2.2)
Albumin: 4.5 g/dL (ref 4.0–5.0)
Alkaline Phosphatase: 95 IU/L (ref 44–121)
BUN/Creatinine Ratio: 15 (ref 9–23)
BUN: 11 mg/dL (ref 6–20)
Bilirubin Total: <0.2 mg/dL (ref 0.0–1.2)
CO2: 23 mmol/L (ref 20–29)
Calcium: 9.6 mg/dL (ref 8.7–10.2)
Chloride: 99 mmol/L (ref 96–106)
Creatinine, Ser: 0.71 mg/dL (ref 0.57–1.00)
Globulin, Total: 2.9 g/dL (ref 1.5–4.5)
Glucose: 155 mg/dL — ABNORMAL HIGH (ref 70–99)
Potassium: 4.3 mmol/L (ref 3.5–5.2)
Sodium: 140 mmol/L (ref 134–144)
Total Protein: 7.4 g/dL (ref 6.0–8.5)
eGFR: 120 mL/min/{1.73_m2} (ref >59)

## 2022-04-20 LAB — MICROALBUMIN / CREATININE URINE RATIO
Creatinine, Urine: 32.4 mg/dL
Microalb/Creat Ratio: 11 mg/g creat (ref 0–29)
Microalbumin, Urine: 3.5 ug/mL

## 2022-04-21 ENCOUNTER — Encounter: Payer: Self-pay | Admitting: Family Medicine

## 2022-04-22 ENCOUNTER — Encounter: Payer: Self-pay | Admitting: Family Medicine

## 2022-05-05 ENCOUNTER — Other Ambulatory Visit: Payer: Self-pay | Admitting: Family Medicine

## 2022-05-08 NOTE — Telephone Encounter (Signed)
Requested Prescriptions  Pending Prescriptions Disp Refills   BD PEN NEEDLE NANO 2ND GEN 32G X 4 MM MISC [Pharmacy Med Name: B-D NANO 2ND GEN PEN NDL [id number]] 100 each 2    Sig: USE TO INJECT INSULIN TWICE DAILY     Endocrinology: Diabetes - Testing Supplies Passed - 05/05/2022  9:56 PM      Passed - Valid encounter within last 12 months    Recent Outpatient Visits           2 weeks ago Type 1 diabetes mellitus with hypoglycemia and without coma ([place])   [place], Dante, MD   7 months ago Type 1 diabetes mellitus with hypoglycemia and without coma [facility])   [place], Charlane Ferretti, MD   10 months ago Pap smear for cervical cancer screening   [place] Karle Plumber B, MD   1 year ago Type 1 diabetes mellitus with hypoglycemia and without coma [facility])   [place], Charlane Ferretti, MD   2 years ago Type 1 diabetes mellitus with hypoglycemia and without coma [facility])   [place] Charlott Rakes, MD       Future Appointments             In 1 month [place], Melanie Crazier, MD [facility] Endocrinology   In 5 months Charlott Rakes, MD [place]

## 2022-05-09 DIAGNOSIS — H04123 Dry eye syndrome of bilateral lacrimal glands: Secondary | ICD-10-CM | POA: Diagnosis not present

## 2022-05-09 DIAGNOSIS — E109 Type 1 diabetes mellitus without complications: Secondary | ICD-10-CM | POA: Diagnosis not present

## 2022-05-31 ENCOUNTER — Encounter: Payer: Self-pay | Admitting: Family Medicine

## 2022-06-06 ENCOUNTER — Encounter: Payer: Self-pay | Admitting: Family Medicine

## 2022-06-09 ENCOUNTER — Other Ambulatory Visit: Payer: Self-pay | Admitting: Family Medicine

## 2022-06-09 MED ORDER — LIDOCAINE VISCOUS HCL 2 % MT SOLN: 5.0000 mL | Freq: Four times a day (QID) | OROMUCOSAL | 0 refills | Status: DC | PRN

## 2022-06-18 ENCOUNTER — Encounter: Payer: Self-pay | Admitting: Family Medicine

## 2022-06-20 ENCOUNTER — Ambulatory Visit: Payer: Medicaid Other | Admitting: Internal Medicine

## 2022-06-29 ENCOUNTER — Encounter: Payer: Self-pay | Admitting: Family Medicine

## 2022-07-11 ENCOUNTER — Encounter: Payer: Self-pay | Admitting: Family Medicine

## 2022-07-19 ENCOUNTER — Other Ambulatory Visit: Payer: Self-pay | Admitting: Family Medicine

## 2022-07-20 ENCOUNTER — Ambulatory Visit: Payer: Medicaid Other | Admitting: Pharmacist

## 2022-07-20 NOTE — Telephone Encounter (Signed)
Dc'd 04/18/22 Completed course- by Dr Alvis Lemmings-   Requested Prescriptions  Refused Prescriptions Disp Refills   benzonatate (TESSALON) 100 MG capsule [Pharmacy Med Name: BENZONATATE 100MG  CAPSULES] 20 capsule 0    Sig: TAKE 1 CAPSULE(100 MG) BY MOUTH TWICE DAILY AS NEEDED FOR COUGH     Ear, Nose, and Throat:  Antitussives/Expectorants Passed - 07/19/2022  6:51 PM      Passed - Valid encounter within last 12 months    Recent Outpatient Visits           3 months ago Type 1 diabetes mellitus with hypoglycemia and without coma (HCC)   [place] [facility] [place], [place], MD   9 months ago Type 1 diabetes mellitus with hypoglycemia and without coma [facility])   [place] [facility] Hoy Register, MD   1 year ago Pap smear for cervical cancer screening   [place] [facility] [place] Jonah Blue B, MD   1 year ago Type 1 diabetes mellitus with hypoglycemia and without coma [facility])   [place] [facility] [place], Odette Horns, MD   2 years ago Type 1 diabetes mellitus with hypoglycemia and without coma [facility])   [place] [facility] Hoy Register, MD       Future Appointments             In 2 months Hoy Register, MD [facility]

## 2022-07-26 ENCOUNTER — Encounter: Payer: Self-pay | Admitting: Family Medicine

## 2022-07-27 ENCOUNTER — Other Ambulatory Visit: Payer: Self-pay

## 2022-07-27 DIAGNOSIS — E109 Type 1 diabetes mellitus without complications: Secondary | ICD-10-CM

## 2022-07-27 MED ORDER — ACCU-CHEK FASTCLIX LANCETS MISC: 0 refills | Status: DC

## 2022-07-27 MED ORDER — ACCU-CHEK GUIDE VI STRP: ORAL_STRIP | 2 refills | Status: DC

## 2022-07-27 MED ORDER — ACCU-CHEK GUIDE ME W/DEVICE KIT: 1.0000 | PACK | Freq: Every day | 0 refills | Status: DC

## 2022-08-06 ENCOUNTER — Other Ambulatory Visit: Payer: Self-pay | Admitting: Family Medicine

## 2022-08-06 DIAGNOSIS — E10649 Type 1 diabetes mellitus with hypoglycemia without coma: Secondary | ICD-10-CM

## 2022-08-26 ENCOUNTER — Encounter: Payer: Self-pay | Admitting: Family Medicine

## 2022-08-28 ENCOUNTER — Other Ambulatory Visit: Payer: Self-pay | Admitting: Family Medicine

## 2022-08-28 MED ORDER — NORETHIN ACE-ETH ESTRAD-FE 1-20 MG-MCG PO TABS: 1.0000 | ORAL_TABLET | Freq: Every day | ORAL | 1 refills | Status: DC

## 2022-08-31 ENCOUNTER — Other Ambulatory Visit: Payer: Self-pay | Admitting: Family Medicine

## 2022-08-31 MED ORDER — NORETHINDRONE 0.35 MG PO TABS: 1.0000 | ORAL_TABLET | Freq: Every day | ORAL | 1 refills | Status: DC

## 2022-10-17 ENCOUNTER — Ambulatory Visit: Payer: Medicaid Other | Attending: Family Medicine | Admitting: Family Medicine

## 2022-10-17 ENCOUNTER — Other Ambulatory Visit: Payer: Self-pay | Admitting: Pharmacist

## 2022-10-17 ENCOUNTER — Encounter: Payer: Self-pay | Admitting: Family Medicine

## 2022-10-17 ENCOUNTER — Ambulatory Visit: Payer: Self-pay | Admitting: *Deleted

## 2022-10-17 VITALS — BP 110/75 | HR 87 | Temp 98.2°F | Ht 64.0 in | Wt 125.0 lb

## 2022-10-17 DIAGNOSIS — E10649 Type 1 diabetes mellitus with hypoglycemia without coma: Secondary | ICD-10-CM

## 2022-10-17 DIAGNOSIS — Z794 Long term (current) use of insulin: Secondary | ICD-10-CM

## 2022-10-17 DIAGNOSIS — R000 Tachycardia, unspecified: Secondary | ICD-10-CM

## 2022-10-17 DIAGNOSIS — I152 Hypertension secondary to endocrine disorders: Secondary | ICD-10-CM | POA: Diagnosis not present

## 2022-10-17 DIAGNOSIS — I10 Essential (primary) hypertension: Secondary | ICD-10-CM

## 2022-10-17 DIAGNOSIS — E1159 Type 2 diabetes mellitus with other circulatory complications: Secondary | ICD-10-CM

## 2022-10-17 LAB — POCT GLYCOSYLATED HEMOGLOBIN (HGB A1C): HbA1c, POC (controlled diabetic range): 6.3 % (ref 0.0–7.0)

## 2022-10-17 MED ORDER — LISINOPRIL-HYDROCHLOROTHIAZIDE 20-25 MG PO TABS: 1.0000 | ORAL_TABLET | Freq: Every day | ORAL | 1 refills | Status: DC

## 2022-10-17 MED ORDER — METOPROLOL SUCCINATE ER 25 MG PO TB24: ORAL_TABLET | ORAL | 1 refills | Status: DC

## 2022-10-17 MED ORDER — INSULIN ASP PROT & ASP FLEXPEN (70-30) 100 UNIT/ML ~~LOC~~ SUPN: 15.0000 [IU] | PEN_INJECTOR | Freq: Two times a day (BID) | SUBCUTANEOUS | 1 refills | Status: DC

## 2022-10-17 MED ORDER — INSULIN ASP PROT & ASP FLEXPEN (70-30) 100 UNIT/ML ~~LOC~~ SUPN: 15.0000 [IU] | PEN_INJECTOR | Freq: Two times a day (BID) | SUBCUTANEOUS | 6 refills | Status: DC

## 2022-10-17 MED ORDER — NOVOLOG FLEXPEN 100 UNIT/ML ~~LOC~~ SOPN
0.0000 [IU] | PEN_INJECTOR | Freq: Three times a day (TID) | SUBCUTANEOUS | 6 refills | Status: DC
Start: 2022-10-17 — End: 2023-11-05

## 2022-10-17 NOTE — Progress Notes (Signed)
Subjective:  Patient ID: Morgan Moon, female    DOB: Mar 22, 1995  Age: 27 y.o. MRN: [id number]  CC: Medical Management of Chronic Issues   HPI Morgan Moon is a 27 y.o. year old female with a history of type 1 diabetes mellitus (A1c 6.3), hypertension, vitiligo, learning disability.   Interval History: Discussed the use of AI scribe software for clinical note transcription with the patient, who gave verbal consent to proceed.  She presents with concerns about her A1c and hypoglycemic episodes. She reports excellent control of her diabetes with an A1c of 6.3, but has been experiencing episodes of hypoglycemia, with blood sugars dropping to around 50. These episodes typically occur in the middle of the day, particularly when she is active and out running errands. She denies nocturnal hypoglycemia or morning symptoms of hypoglycemia such as sweating.  The patient has been managing her diabetes with Novolog 70/30, taking 18 units in the morning and 15 units at night. She also takes NovoLog on a sliding scale, 0 to 12 units with each meal, and does not take it when she does not eat. She is also on lisinopril and hydrochlorothiazide for blood pressure control.  In addition to her diabetes, the patient also reports taking Zofran for nausea and Meloxicam for cramps. She denies taking any birth control pills. She has been using a Libre meter to monitor her blood sugars.  The patient reports that her mother has been helping her with her diet and medication management, which has contributed to her improved diabetes control. She has been disciplined with her diet and is proud of her progress.        Past Medical History:  Diagnosis Date   Diabetes mellitus without complication (HCC)    Hypertension     No past surgical history on file.  Family History  Problem Relation Age of Onset   Hypertension Maternal Aunt    Hypertension Maternal Uncle    Hypertension Maternal Grandmother     Hypertension Maternal Grandfather     Social History   Socioeconomic History   Marital status: Single    Spouse name: Not on file   Number of children: Not on file   Years of education: Not on file   Highest education level: Not on file  Occupational History   Not on file  Tobacco Use   Smoking status: Never   Smokeless tobacco: Never  Substance and Sexual Activity   Alcohol use: No   Drug use: No   Sexual activity: Not on file  Other Topics Concern   Not on file  Social History Narrative   Not on file   Social Determinants of Health   Financial Resource Strain: Not on file  Food Insecurity: Not on file  Transportation Needs: Not on file  Physical Activity: Not on file  Stress: Not on file  Social Connections: Not on file    Allergies  Allergen Reactions   Amlodipine Other (See Comments)    headaches    Outpatient Medications Prior to Visit  Medication Sig Dispense Refill   Accu-Chek FastClix Lancets MISC USE TO TEST UP TO THREE TIMES DAILY AS DIRECTED 102 each 0   Blood Glucose Monitoring Suppl (ACCU-CHEK GUIDE ME) w/Device KIT 1 kit by Does not apply route daily. 1 kit 0   Continuous Blood Gluc Receiver (FREESTYLE LIBRE 2 READER) DEVI Use to check blood sugar TID. E10.649 1 each 0   Continuous Blood Gluc Sensor (FREESTYLE LIBRE 2 SENSOR) MISC  Use to check blood sugar TID. Change sensors once every 14 days. E10.649 2 each 3   FEROSUL 325 (65 Fe) MG tablet TAKE 1 TABLET BY MOUTH TWICE DAILY WITH MEAL. 60 tablet 2   glucose blood (ACCU-CHEK GUIDE) test strip TEST BLOOD SUGAR UP TO THREE TIMES DAILY AS NEEDED 100 strip 2   ibuprofen (ADVIL) 600 MG tablet Take 1 tablet (600 mg total) by mouth every 6 (six) hours as needed. Take with food. 30 tablet 0   insulin aspart (NOVOLOG FLEXPEN) 100 UNIT/ML FlexPen Inject 0-12 Units into the skin 3 (three) times daily with meals. As per sliding scale 30 mL 6   Insulin Pen Needle (BD PEN NEEDLE NANO 2ND GEN) 32G X 4 MM MISC USE TO  INJECT INSULIN TWICE DAILY 100 each 2   Insulin Syringe-Needle U-100 (BD INSULIN SYRINGE ULTRAFINE) 31G X 15/64" 0.5 ML MISC Use as directed twice daily. 300 each 3   lidocaine (XYLOCAINE) 2 % solution Use as directed 5 mLs in the mouth or throat every 6 (six) hours as needed for mouth pain. And spit out after 100 mL 0   meloxicam (MOBIC) 7.5 MG tablet TAKE 1 TABLET(7.5 MG) BY MOUTH DAILY 30 tablet 1   Misc. Devices MISC Blood pressure monitor.  Diagnosis hypertension 1 each 0   Nutritional Supplements (GLUCERNA 1.0 CAL) LIQD Take 1 each by mouth 3 (three) times daily as needed. Strawberry or vanilla 1500 mL 1   ondansetron (ZOFRAN-ODT) 4 MG disintegrating tablet DISSOLVE 1 TABLET(4 MG) ON THE TONGUE EVERY 8 HOURS AS NEEDED FOR NAUSEA OR VOMITING 30 tablet 1   Insulin Asp Prot & Asp FlexPen (NOVOLOG 70/30 MIX) (70-30) 100 UNIT/ML FlexPen INJECT 18 UNITS UNDER THE SKIN IN THE MORNING AND 15 UNITS AT NIGHT 15 mL 1   norethindrone (MICRONOR) 0.35 MG tablet TAKE 1 TABLET(0.35 MG) BY MOUTH DAILY 84 tablet 0   lisinopril-hydrochlorothiazide (ZESTORETIC) 20-25 MG tablet Take 1 tablet by mouth daily. 90 tablet 1   metoprolol succinate (TOPROL-XL) 25 MG 24 hr tablet TAKE 1 TABLET(25 MG) BY MOUTH DAILY 90 tablet 1   No facility-administered medications prior to visit.     ROS Review of Systems  Constitutional:  Negative for activity change and appetite change.  HENT:  Negative for sinus pressure and sore throat.   Respiratory:  Negative for chest tightness, shortness of breath and wheezing.   Cardiovascular:  Negative for chest pain and palpitations.  Gastrointestinal:  Negative for abdominal distention, abdominal pain and constipation.  Genitourinary: Negative.   Musculoskeletal: Negative.   Skin:  Positive for color change.  Psychiatric/Behavioral:  Negative for behavioral problems and dysphoric mood.     Objective:  BP 110/75   Pulse 87   Temp 98.2 F (36.8 C) (Oral)   Ht 5\' 4"  (1.626 m)    Wt 125 lb (56.7 kg)   SpO2 99%   BMI 21.46 kg/m      10/17/2022   10:32 AM 04/18/2022    3:47 PM 01/28/2022   11:42 AM  BP/Weight  Systolic BP 110 132 136  Diastolic BP 75 84 79  Wt. (Lbs) 125 129.4   BMI 21.46 kg/m2 22.21 kg/m2       Physical Exam Constitutional:      Appearance: She is well-developed.  Cardiovascular:     Rate and Rhythm: Normal rate.     Heart sounds: Normal heart sounds. No murmur heard. Pulmonary:     Effort: Pulmonary effort is normal.  Breath sounds: Normal breath sounds. No wheezing or rales.  Chest:     Chest wall: No tenderness.  Abdominal:     General: Bowel sounds are normal. There is no distension.     Palpations: Abdomen is soft. There is no mass.     Tenderness: There is no abdominal tenderness.  Musculoskeletal:        General: Normal range of motion.     Right lower leg: No edema.     Left lower leg: No edema.  Skin:    Comments: Vitiligo patches scattered on body surface  Neurological:     Mental Status: She is alert and oriented to person, place, and time.  Psychiatric:        Mood and Affect: Mood normal.    Diabetic Foot Exam - Simple   Simple Foot Form Diabetic Foot exam was performed with the following findings: Yes 10/17/2022 11:28 AM  Visual Inspection No deformities, no ulcerations, no other skin breakdown bilaterally: Yes Sensation Testing Intact to touch and monofilament testing bilaterally: Yes Pulse Check Posterior Tibialis and Dorsalis pulse intact bilaterally: Yes Comments        Latest Ref Rng & Units 04/18/2022    4:20 PM 10/10/2021    4:45 PM 01/04/2021    3:34 PM  CMP  Glucose 70 - 99 mg/dL 161  096  045   BUN 6 - 20 mg/dL 11  14  14    Creatinine 0.57 - 1.00 mg/dL 4.09  8.11  9.14   Sodium 134 - 144 mmol/L 140  138  135   Potassium 3.5 - 5.2 mmol/L 4.3  5.0  5.0   Chloride 96 - 106 mmol/L 99  97  96   CO2 20 - 29 mmol/L 23  24  24    Calcium 8.7 - 10.2 mg/dL 9.6  9.9  78.2   Total Protein 6.0  - 8.5 g/dL 7.4  7.9    Total Bilirubin 0.0 - 1.2 mg/dL <9.5  0.2    Alkaline Phos 44 - 121 IU/L 95  118    AST 0 - 40 IU/L 17  22    ALT 0 - 32 IU/L 11  15      Lipid Panel     Component Value Date/Time   CHOL 149 03/24/2016 1120   TRIG 46 03/24/2016 1120   HDL 60 03/24/2016 1120   CHOLHDL 2.5 03/24/2016 1120   VLDL 9 03/24/2016 1120   LDLCALC 80 03/24/2016 1120    CBC    Component Value Date/Time   WBC 2.9 (L) 04/26/2020 1002   WBC 4.9 06/27/2015 1220   RBC 4.74 04/26/2020 1002   RBC 5.54 (H) 06/27/2015 1220   HGB 12.0 04/26/2020 1002   HCT 37.5 04/26/2020 1002   PLT 320 04/26/2020 1002   MCV 79 04/26/2020 1002   MCH 25.3 (L) 04/26/2020 1002   MCH 17.3 (L) 06/27/2015 1220   MCHC 32.0 04/26/2020 1002   MCHC 29.1 (L) 06/27/2015 1220   RDW 15.4 04/26/2020 1002   LYMPHSABS 1.2 04/26/2020 1002   MONOABS 0.4 06/27/2015 1220   EOSABS 0.1 04/26/2020 1002   BASOSABS 0.0 04/26/2020 1002    Lab Results  Component Value Date   HGBA1C 6.3 10/17/2022    Assessment & Plan:      Type 1 Diabetes Mellitus: Excellent glycemic control with A1c of 6.3. However, experiencing hypoglycemic episodes (down to 50) during the day. No nocturnal hypoglycemia reported. -Reduce morning dose of Novolog 70/30  from 18 units to 15 units and continue 15 units in the evening. -Continue Novolog sliding scale 0-12 units with meals.  Hypertension associated with diabetes: Controlled -Continue current regimen of Lisinopril and Hydrochlorothiazide for blood pressure control.  Sinus tachycardia: Controlled on metoprolol  General Health Maintenance: -Order labs for cholesterol, kidney, and liver function. -Continue Meloxicam and Zofran as needed. -Continue regular foot exams and eye exams (next due in 2020). -Encourage regular exercise. -Schedule follow-up appointment in 6 months.           Meds ordered this encounter  Medications   lisinopril-hydrochlorothiazide (ZESTORETIC) 20-25 MG  tablet    Sig: Take 1 tablet by mouth daily.    Dispense:  90 tablet    Refill:  1   metoprolol succinate (TOPROL-XL) 25 MG 24 hr tablet    Sig: TAKE 1 TABLET(25 MG) BY MOUTH DAILY    Dispense:  90 tablet    Refill:  1   Insulin Asp Prot & Asp FlexPen (NOVOLOG 70/30 MIX) (70-30) 100 UNIT/ML FlexPen    Sig: Inject 15 Units into the skin 2 (two) times daily. INJECT 18 UNITS UNDER THE SKIN IN THE MORNING AND 15 UNITS AT NIGHT    Dispense:  30 mL    Refill:  6    Follow-up: Return in about 6 months (around 04/19/2023) for Chronic medical conditions.       Hoy Register, MD, FAAFP. [facility] [place], [place] [phone]   10/17/2022, 11:23 AM

## 2022-10-17 NOTE — Telephone Encounter (Signed)
[facility] is calling in because they need clarification with the medication Insulin Asp Prot & Asp FlexPen (NOVOLOG 70/30 MIX) (70-30) 100 UNIT/ML FlexPen [[id number]] for this pt. Pharmacy says there are two sets of instructions on the prescription.   Chief Complaint: Medication clarification Symptoms: NA Frequency: NA Pertinent Negatives: Patient denies NA Disposition: [] ED /[] Urgent Care (no appt availability in office) / [] Appointment(In office/virtual)/ []  [place] [facility]/ [] Home Care/ [] Refused Recommended Disposition /[] [place] [facility]/ [x]  Follow-up with PCP Additional Notes:  Pharmacy requesting clarification:  Insulin Asp Prot & Asp FlexPen (NOVOLOG 70/30 MIX) (70-30) 100 UNIT/ML FlexPen Inject 15 Units into the skin 2 (two) times daily. INJECT 18 UNITS UNDER THE SKIN IN THE MORNING AND 15 UNITS AT NIGHT Dispense: 30 mL, Refills: 6 ordered    Please advise.  Reason for Disposition  [1] Caller has URGENT medicine question about med that PCP or specialist prescribed AND [2] triager unable to answer question  Answer Assessment - Initial Assessment Questions 1. NAME of MEDICINE: "What medicine(s) are you calling about?"     Novolog 2. QUESTION: "What is your question?" (e.g., double dose of medicine, side effect)     Clarification. 3. PRESCRIBER: "Who prescribed the medicine?" Reason: if prescribed by specialist, call should be referred to that group.     PCP  Protocols used: Medication Question Call-A-AH

## 2022-10-17 NOTE — Telephone Encounter (Signed)
Re: PCP notes Novolog 70/30 Reduce morning dose of Novolog 70/30 from 18 units to 15 units and continue 15 units in the evening. -Continue Novolog sliding scale 0-12 units with meals.  Pharmacist to send update script

## 2022-10-17 NOTE — Patient Instructions (Signed)
Exercising to Stay Healthy To become healthy and stay healthy, it is recommended that you do moderate-intensity and vigorous-intensity exercise. You can tell that you are exercising at a moderate intensity if your heart starts beating faster and you start breathing faster but can still hold a conversation. You can tell that you are exercising at a vigorous intensity if you are breathing much harder and faster and cannot hold a conversation while exercising. How can exercise benefit me? Exercising regularly is important. It has many health benefits, such as: Improving overall fitness, flexibility, and endurance. Increasing bone density. Helping with weight control. Decreasing body fat. Increasing muscle strength and endurance. Reducing stress and tension, anxiety, depression, or anger. Improving overall health. What guidelines should I follow while exercising? Before you start a new exercise program, talk with your health care provider. Do not exercise so much that you hurt yourself, feel dizzy, or get very short of breath. Wear comfortable clothes and wear shoes with good support. Drink plenty of water while you exercise to prevent dehydration or heat stroke. Work out until your breathing and your heartbeat get faster (moderate intensity). How often should I exercise? Choose an activity that you enjoy, and set realistic goals. Your health care provider can help you make an activity plan that is individually designed and works best for you. Exercise regularly as told by your health care provider. This may include: Doing strength training two times a week, such as: Lifting weights. Using resistance bands. Push-ups. Sit-ups. Yoga. Doing a certain intensity of exercise for a given amount of time. Choose from these options: A total of 150 minutes of moderate-intensity exercise every week. A total of 75 minutes of vigorous-intensity exercise every week. A mix of moderate-intensity and  vigorous-intensity exercise every week. Children, pregnant women, people who have not exercised regularly, people who are overweight, and older adults may need to talk with a health care provider about what activities are safe to perform. If you have a medical condition, be sure to talk with your health care provider before you start a new exercise program. What are some exercise ideas? Moderate-intensity exercise ideas include: Walking 1 mile (1.6 km) in about 15 minutes. Biking. Hiking. Golfing. Dancing. Water aerobics. Vigorous-intensity exercise ideas include: Walking 4.5 miles (7.2 km) or more in about 1 hour. Jogging or running 5 miles (8 km) in about 1 hour. Biking 10 miles (16.1 km) or more in about 1 hour. Lap swimming. Roller-skating or in-line skating. Cross-country skiing. Vigorous competitive sports, such as football, basketball, and soccer. Jumping rope. Aerobic dancing. What are some everyday activities that can help me get exercise? Yard work, such as: Pushing a lawn mower. Raking and bagging leaves. Washing your car. Pushing a stroller. Shoveling snow. Gardening. Washing windows or floors. How can I be more active in my day-to-day activities? Use stairs instead of an elevator. Take a walk during your lunch break. If you drive, park your car farther away from your work or school. If you take public transportation, get off one stop early and walk the rest of the way. Stand up or walk around during all of your indoor phone calls. Get up, stretch, and walk around every 30 minutes throughout the day. Enjoy exercise with a friend. Support to continue exercising will help you keep a regular routine of activity. Where to find more information You can find more information about exercising to stay healthy from: U.S. Department of Health and Human Services: www.hhs.gov Centers for Disease Control and Prevention (  CDC): www.cdc.gov Summary Exercising regularly is  important. It will improve your overall fitness, flexibility, and endurance. Regular exercise will also improve your overall health. It can help you control your weight, reduce stress, and improve your bone density. Do not exercise so much that you hurt yourself, feel dizzy, or get very short of breath. Before you start a new exercise program, talk with your health care provider. This information is not intended to replace advice given to you by your health care provider. Make sure you discuss any questions you have with your health care provider. Document Revised: 07/02/2020 Document Reviewed: 07/02/2020 Elsevier Patient Education  2024 Elsevier Inc.  

## 2022-10-18 ENCOUNTER — Encounter: Payer: Self-pay | Admitting: Family Medicine

## 2022-10-18 ENCOUNTER — Other Ambulatory Visit: Payer: Self-pay | Admitting: Family Medicine

## 2022-10-18 MED ORDER — IRON (FERROUS SULFATE) 325 (65 FE) MG PO TABS: 325.0000 mg | ORAL_TABLET | Freq: Every day | ORAL | 6 refills | Status: DC

## 2022-11-23 ENCOUNTER — Other Ambulatory Visit: Payer: Self-pay | Admitting: Family Medicine

## 2022-12-11 ENCOUNTER — Other Ambulatory Visit: Payer: Self-pay | Admitting: Family Medicine

## 2022-12-12 NOTE — Telephone Encounter (Signed)
Requested Prescriptions  Pending Prescriptions Disp Refills   glucose blood (ACCU-CHEK GUIDE) test strip [Pharmacy Med Name: ACCU-CHEK GUIDE TEST STRIPS 100S] 100 strip 2    Sig: USE TO TEST BLOOD SUGAR LEVELS UP TO THREE TIMES DAILY AS NEEDED.     Endocrinology: Diabetes - Testing Supplies Passed - 12/11/2022  3:52 AM      Passed - Valid encounter within last 12 months    Recent Outpatient Visits           1 month ago Type 1 diabetes mellitus with hypoglycemia and without coma (HCC)   [place] [facility] [place], [place], MD   7 months ago Type 1 diabetes mellitus with hypoglycemia and without coma [facility])   [place] [facility] [place], Odette Horns, MD   1 year ago Type 1 diabetes mellitus with hypoglycemia and without coma [facility])   [place] [facility] Hoy Register, MD   1 year ago Pap smear for cervical cancer screening   [facility] Jonah Blue B, MD   1 year ago Type 1 diabetes mellitus with hypoglycemia and without coma [facility])   [place] [facility] Hoy Register, MD       Future Appointments             In 4 months Hoy Register, MD [facility] [place]

## 2022-12-14 ENCOUNTER — Other Ambulatory Visit: Payer: Self-pay | Admitting: Family Medicine

## 2022-12-15 NOTE — Telephone Encounter (Signed)
Requested Prescriptions  Refused Prescriptions Disp Refills   benzonatate (TESSALON) 100 MG capsule [Pharmacy Med Name: BENZONATATE 100MG  CAPSULES] 20 capsule 0    Sig: TAKE 1 CAPSULE(100 MG) BY MOUTH TWICE DAILY AS NEEDED FOR COUGH     Ear, Nose, and Throat:  Antitussives/Expectorants Passed - 12/14/2022  9:49 PM      Passed - Valid encounter within last 12 months    Recent Outpatient Visits           1 month ago Type 1 diabetes mellitus with hypoglycemia and without coma (HCC)   [place] [facility] [place], [place], MD   8 months ago Type 1 diabetes mellitus with hypoglycemia and without coma [facility])   [place] [facility] [address], Odette Horns, MD   1 year ago Type 1 diabetes mellitus with hypoglycemia and without coma [facility])   [place] [facility] Hoy Register, MD   1 year ago Pap smear for cervical cancer screening   [place] [facility] Jonah Blue B, MD   1 year ago Type 1 diabetes mellitus with hypoglycemia and without coma [facility])   [place] [facility] Hoy Register, MD       Future Appointments             In 4 months Hoy Register, MD [facility]

## 2022-12-18 ENCOUNTER — Other Ambulatory Visit: Payer: Self-pay | Admitting: Family Medicine

## 2022-12-18 ENCOUNTER — Encounter: Payer: Self-pay | Admitting: Family Medicine

## 2022-12-18 MED ORDER — CLINDAMYCIN PHOS-BENZOYL PEROX 1-5 % EX GEL: Freq: Two times a day (BID) | CUTANEOUS | 1 refills | Status: DC

## 2022-12-20 ENCOUNTER — Other Ambulatory Visit: Payer: Self-pay | Admitting: Pharmacist

## 2022-12-20 ENCOUNTER — Telehealth: Payer: Self-pay

## 2022-12-20 MED ORDER — CLINDAMYCIN PHOS-BENZOYL PEROX 1.2-5 % EX GEL: 1.0000 | Freq: Two times a day (BID) | CUTANEOUS | 0 refills | Status: AC

## 2022-12-20 NOTE — Telephone Encounter (Signed)
Benzaclin is non-preferred on patient's insurance. If appropriate, can this be changed to one of the following preferred medications:

## 2022-12-31 ENCOUNTER — Other Ambulatory Visit: Payer: Self-pay | Admitting: Family Medicine

## 2022-12-31 DIAGNOSIS — E109 Type 1 diabetes mellitus without complications: Secondary | ICD-10-CM

## 2023-01-01 NOTE — Telephone Encounter (Signed)
Requested Prescriptions  Pending Prescriptions Disp Refills   Accu-Chek FastClix Lancets MISC [Pharmacy Med Name: ACCU-CHEK FASTCLIX LANCETS 102'S] 300 each 0    Sig: USE TO TEST UP TO THREE TIMES DAILY AS DIRECTED     Endocrinology: Diabetes - Testing Supplies Passed - 12/31/2022  3:52 AM      Passed - Valid encounter within last 12 months    Recent Outpatient Visits           2 months ago Type 1 diabetes mellitus with hypoglycemia and without coma (HCC)   [place] [facility] [place], [place], MD   8 months ago Type 1 diabetes mellitus with hypoglycemia and without coma [facility])   [place] [facility] [place], Odette Horns, MD   1 year ago Type 1 diabetes mellitus with hypoglycemia and without coma [facility])   [place] [facility] Hoy Register, MD   1 year ago Pap smear for cervical cancer screening   [place] [facility] Jonah Blue B, MD   1 year ago Type 1 diabetes mellitus with hypoglycemia and without coma [facility])   [place] [facility] Hoy Register, MD       Future Appointments             In 3 months Hoy Register, MD [facility]

## 2023-01-06 ENCOUNTER — Other Ambulatory Visit: Payer: Self-pay | Admitting: Family Medicine

## 2023-01-08 NOTE — Telephone Encounter (Signed)
Requested Prescriptions  Pending Prescriptions Disp Refills   meloxicam (MOBIC) 7.5 MG tablet [Pharmacy Med Name: MELOXICAM 7.5MG  TABLETS] 30 tablet 1    Sig: TAKE 1 TABLET(7.5 MG) BY MOUTH DAILY     Analgesics:  COX2 Inhibitors Failed - 01/06/2023  9:01 AM      Failed - Manual Review: Labs are only required if the patient has taken medication for more than 8 weeks.      Failed - HGB in normal range and within 360 days    Hemoglobin  Date Value Ref Range Status  10/17/2022 10.4 (L) 11.1 - 15.9 g/dL Final         Failed - HCT in normal range and within 360 days    Hematocrit  Date Value Ref Range Status  10/17/2022 31.9 (L) 34.0 - 46.6 % Final         Passed - Cr in normal range and within 360 days    Creat  Date Value Ref Range Status  03/10/2016 0.72 0.50 - 1.10 mg/dL Final   Creatinine, Ser  Date Value Ref Range Status  10/17/2022 0.97 0.57 - 1.00 mg/dL Final   Creatinine, POC  Date Value Ref Range Status  12/04/2016 100 mg/dL Final         Passed - AST in normal range and within 360 days    AST  Date Value Ref Range Status  10/17/2022 27 0 - 40 IU/L Final         Passed - ALT in normal range and within 360 days    ALT  Date Value Ref Range Status  10/17/2022 17 0 - 32 IU/L Final         Passed - eGFR is 30 or above and within 360 days    GFR calc Af Amer  Date Value Ref Range Status  04/26/2020 119 >59 mL/min/1.73 Final    Comment:    **In accordance with recommendations from the NKF-ASN Task force,**   [facility] is in the process of updating its eGFR calculation to the   2021 CKD-EPI creatinine equation that estimates kidney function   without a race variable.    GFR calc non Af Amer  Date Value Ref Range Status  04/26/2020 104 >59 mL/min/1.73 Final   eGFR  Date Value Ref Range Status  10/17/2022 83 >59 mL/min/1.73 Final         Passed - Patient is not pregnant      Passed - Valid encounter within last 12 months    Recent Outpatient Visits            2 months ago Type 1 diabetes mellitus with hypoglycemia and without coma (HCC)   [place] [facility] [place], [place], MD   8 months ago Type 1 diabetes mellitus with hypoglycemia and without coma [facility])   [place] [facility] [place], Odette Horns, MD   1 year ago Type 1 diabetes mellitus with hypoglycemia and without coma [facility])   [place] [facility] Hoy Register, MD   1 year ago Pap smear for cervical cancer screening   [place] [facility] Jonah Blue B, MD   2 years ago Type 1 diabetes mellitus with hypoglycemia and without coma [facility])   [place] [facility] Hoy Register, MD       Future Appointments             In 3 months  Hoy Register, MD [facility] [place]

## 2023-01-18 ENCOUNTER — Other Ambulatory Visit: Payer: Self-pay | Admitting: Family Medicine

## 2023-01-18 ENCOUNTER — Encounter: Payer: Self-pay | Admitting: Family Medicine

## 2023-01-18 NOTE — Telephone Encounter (Signed)
Requested medication (s) are due for refill today:   Provider to Review  Requested medication (s) are on the active medication list:   Yes  Future visit scheduled:   Yes 04/24/2023 with Dr. Alvis Lemmings   Last ordered: 01/16/2022 #30, 1 refill  Non delegated refill    Requested Prescriptions  Pending Prescriptions Disp Refills   ondansetron (ZOFRAN-ODT) 4 MG disintegrating tablet [Pharmacy Med Name: ONDANSETRON ODT 4MG  TABLETS] 30 tablet 1    Sig: DISSOLVE 1 TABLET(4 MG) ON THE TONGUE EVERY 8 HOURS AS NEEDED FOR NAUSEA OR VOMITING     Not Delegated - Gastroenterology: Antiemetics - ondansetron Failed - 01/18/2023  2:11 PM      Failed - This refill cannot be delegated      Passed - AST in normal range and within 360 days    AST  Date Value Ref Range Status  10/17/2022 27 0 - 40 IU/L Final         Passed - ALT in normal range and within 360 days    ALT  Date Value Ref Range Status  10/17/2022 17 0 - 32 IU/L Final         Passed - Valid encounter within last 6 months    Recent Outpatient Visits           3 months ago Type 1 diabetes mellitus with hypoglycemia and without coma (HCC)   [place] [facility] [place], [place], MD   9 months ago Type 1 diabetes mellitus with hypoglycemia and without coma [facility])   [place] [facility] [place], Odette Horns, MD   1 year ago Type 1 diabetes mellitus with hypoglycemia and without coma [facility])   [place] [facility] Hoy Register, MD   1 year ago Pap smear for cervical cancer screening   [place] [facility] Jonah Blue B, MD   2 years ago Type 1 diabetes mellitus with hypoglycemia and without coma [facility])   [place] [facility] Hoy Register, MD       Future Appointments             In 3 months Hoy Register, MD [facility]

## 2023-01-25 ENCOUNTER — Ambulatory Visit: Payer: Medicaid Other | Admitting: Family Medicine

## 2023-02-02 ENCOUNTER — Telehealth: Payer: Medicaid Other | Admitting: Physician Assistant

## 2023-02-02 DIAGNOSIS — B9689 Other specified bacterial agents as the cause of diseases classified elsewhere: Secondary | ICD-10-CM

## 2023-02-02 DIAGNOSIS — J069 Acute upper respiratory infection, unspecified: Secondary | ICD-10-CM | POA: Diagnosis not present

## 2023-02-02 MED ORDER — PSEUDOEPH-BROMPHEN-DM 30-2-10 MG/5ML PO SYRP: 5.0000 mL | ORAL_SOLUTION | Freq: Four times a day (QID) | ORAL | 0 refills | Status: DC | PRN

## 2023-02-02 MED ORDER — FLUTICASONE PROPIONATE 50 MCG/ACT NA SUSP: 2.0000 | Freq: Every day | NASAL | 0 refills | Status: AC

## 2023-02-02 MED ORDER — AMOXICILLIN-POT CLAVULANATE 875-125 MG PO TABS: 1.0000 | ORAL_TABLET | Freq: Two times a day (BID) | ORAL | 0 refills | Status: DC

## 2023-02-02 NOTE — Patient Instructions (Signed)
Morgan Moon, thank you for joining Morgan Loveless, PA-C for today's virtual visit.  While this provider is not your primary care provider (PCP), if your PCP is located in our provider database this encounter information will be shared with them immediately following your visit.   A [facility] MyChart account gives you access to today's visit and all your visits, tests, and labs performed at [facility] " click here if you don't have a [facility] MyChart account or go to [email or web address]  Consent: (Patient) Morgan Moon provided verbal consent for this virtual visit at the beginning of the encounter.  Current Medications:  Current Outpatient Medications:    amoxicillin-clavulanate (AUGMENTIN) 875-125 MG tablet, Take 1 tablet by mouth 2 (two) times daily., Disp: 20 tablet, Rfl: 0   brompheniramine-pseudoephedrine-DM 30-2-10 MG/5ML syrup, Take 5 mLs by mouth 4 (four) times daily as needed., Disp: 120 mL, Rfl: 0   fluticasone (FLONASE) 50 MCG/ACT nasal spray, Place 2 sprays into both nostrils daily., Disp: 16 g, Rfl: 0   Accu-Chek FastClix Lancets MISC, USE TO TEST UP TO THREE TIMES DAILY AS DIRECTED, Disp: 300 each, Rfl: 0   Blood Glucose Monitoring Suppl (ACCU-CHEK GUIDE ME) w/Device KIT, 1 kit by Does not apply route daily., Disp: 1 kit, Rfl: 0   Clindamycin-Benzoyl Per, Refr, gel, Apply 1 Application topically 2 (two) times daily., Disp: 45 g, Rfl: 0   Continuous Blood Gluc Receiver (FREESTYLE LIBRE 2 READER) DEVI, Use to check blood sugar TID. E10.649, Disp: 1 each, Rfl: 0   Continuous Blood Gluc Sensor (FREESTYLE LIBRE 2 SENSOR) MISC, Use to check blood sugar TID. Change sensors once every 14 days. E10.649, Disp: 2 each, Rfl: 3   glucose blood (ACCU-CHEK GUIDE) test strip, USE TO TEST BLOOD SUGAR LEVELS UP TO THREE TIMES DAILY AS NEEDED., Disp: 100 strip, Rfl: 2   ibuprofen (ADVIL) 600 MG tablet, Take 1 tablet (600 mg total) by mouth every 6 (six) hours as  needed. Take with food., Disp: 30 tablet, Rfl: 0   Insulin Asp Prot & Asp FlexPen (NOVOLOG 70/30 MIX) (70-30) 100 UNIT/ML FlexPen, Inject 15 Units into the skin 2 (two) times daily., Disp: 30 mL, Rfl: 1   insulin aspart (NOVOLOG FLEXPEN) 100 UNIT/ML FlexPen, Inject 0-12 Units into the skin 3 (three) times daily with meals. Max dose 36 units. As per sliding scale., Disp: 30 mL, Rfl: 6   Insulin Pen Needle (BD PEN NEEDLE NANO 2ND GEN) 32G X 4 MM MISC, USE TO INJECT INSULIN TWICE DAILY, Disp: 100 each, Rfl: 2   Insulin Syringe-Needle U-100 (BD INSULIN SYRINGE ULTRAFINE) 31G X 15/64" 0.5 ML MISC, Use as directed twice daily., Disp: 300 each, Rfl: 3   Iron, Ferrous Sulfate, 325 (65 Fe) MG TABS, Take 325 mg by mouth daily., Disp: 60 tablet, Rfl: 6   lidocaine (XYLOCAINE) 2 % solution, USE AS DIRECTED 5 ML IN THE MOUTH OR THROAT EVERY 6 HOURS AS NEEDED FOR MOUTH PAIN AND SPIT OUT, Disp: 100 mL, Rfl: 0   lisinopril-hydrochlorothiazide (ZESTORETIC) 20-25 MG tablet, Take 1 tablet by mouth daily., Disp: 90 tablet, Rfl: 1   meloxicam (MOBIC) 7.5 MG tablet, TAKE 1 TABLET(7.5 MG) BY MOUTH DAILY, Disp: 30 tablet, Rfl: 1   metoprolol succinate (TOPROL-XL) 25 MG 24 hr tablet, TAKE 1 TABLET(25 MG) BY MOUTH DAILY, Disp: 90 tablet, Rfl: 1   Misc. Devices MISC, Blood pressure monitor.  Diagnosis hypertension, Disp: 1 each, Rfl: 0   Nutritional Supplements (GLUCERNA 1.0  CAL) LIQD, Take 1 each by mouth 3 (three) times daily as needed. Strawberry or vanilla, Disp: 1500 mL, Rfl: 1   ondansetron (ZOFRAN-ODT) 4 MG disintegrating tablet, DISSOLVE 1 TABLET(4 MG) ON THE TONGUE EVERY 8 HOURS AS NEEDED FOR NAUSEA OR VOMITING, Disp: 30 tablet, Rfl: 1   Medications ordered in this encounter:  Meds ordered this encounter  Medications   amoxicillin-clavulanate (AUGMENTIN) 875-125 MG tablet    Sig: Take 1 tablet by mouth 2 (two) times daily.    Dispense:  20 tablet    Refill:  0    Order Specific Question:   Supervising Provider     Answer:   Merrilee Jansky [id number]   brompheniramine-pseudoephedrine-DM 30-2-10 MG/5ML syrup    Sig: Take 5 mLs by mouth 4 (four) times daily as needed.    Dispense:  120 mL    Refill:  0    Order Specific Question:   Supervising Provider    Answer:   Merrilee Jansky [[id number]]   fluticasone (FLONASE) 50 MCG/ACT nasal spray    Sig: Place 2 sprays into both nostrils daily.    Dispense:  16 g    Refill:  0    Order Specific Question:   Supervising Provider    Answer:   Merrilee Jansky [id number]     *If you need refills on other medications prior to your next appointment, please contact your pharmacy*  Follow-Up: Call back or seek an in-person evaluation if the symptoms worsen or if the condition fails to improve as anticipated.  [facility] [phone]  Other Instructions  Upper Respiratory Infection, Adult An upper respiratory infection (URI) is a common viral infection of the nose, throat, and upper air passages that lead to the lungs. The most common type of URI is the common cold. URIs usually get better on their own, without medical treatment. What are the causes? A URI is caused by a virus. You may catch a virus by: Breathing in droplets from an infected person's cough or sneeze. Touching something that has been exposed to the virus (is contaminated) and then touching your mouth, nose, or eyes. What increases the risk? You are more likely to get a URI if: You are very young or very old. You have close contact with others, such as at work, school, or a health care facility. You smoke. You have long-term (chronic) heart or lung disease. You have a weakened disease-fighting system (immune system). You have nasal allergies or asthma. You are experiencing a lot of stress. You have poor nutrition. What are the signs or symptoms? A URI usually involves some of the following symptoms: Runny or stuffy (congested) nose. Cough. Sneezing. Sore  throat. Headache. Fatigue. Fever. Loss of appetite. Pain in your forehead, behind your eyes, and over your cheekbones (sinus pain). Muscle aches. Redness or irritation of the eyes. Pressure in the ears or face. How is this diagnosed? This condition may be diagnosed based on your medical history and symptoms, and a physical exam. Your health care provider may use a swab to take a mucus sample from your nose (nasal swab). This sample can be tested to determine what virus is causing the illness. How is this treated? URIs usually get better on their own within 7-10 days. Medicines cannot cure URIs, but your health care provider may recommend certain medicines to help relieve symptoms, such as: Over-the-counter cold medicines. Cough suppressants. Coughing is a type of defense against infection that helps to clear  the respiratory system, so take these medicines only as recommended by your health care provider. Fever-reducing medicines. Follow these instructions at home: Activity Rest as needed. If you have a fever, stay home from work or school until your fever is gone or until your health care provider says your URI cannot spread to other people (is no longer contagious). Your health care provider may have you wear a face mask to prevent your infection from spreading. Relieving symptoms Gargle with a mixture of salt and water 3-4 times a day or as needed. To make salt water, completely dissolve -1 tsp (3-6 g) of salt in 1 cup (237 mL) of warm water. Use a cool-mist humidifier to add moisture to the air. This can help you breathe more easily. Eating and drinking  Drink enough fluid to keep your urine pale yellow. Eat soups and other clear broths. General instructions  Take over-the-counter and prescription medicines only as told by your health care provider. These include cold medicines, fever reducers, and cough suppressants. Do not use any products that contain nicotine or tobacco. These  products include cigarettes, chewing tobacco, and vaping devices, such as e-cigarettes. If you need help quitting, ask your health care provider. Stay away from secondhand smoke. Stay up to date on all immunizations, including the yearly (annual) flu vaccine. Keep all follow-up visits. This is important. How to prevent the spread of infection to others URIs can be contagious. To prevent the infection from spreading: Wash your hands with soap and water for at least 20 seconds. If soap and water are not available, use hand sanitizer. Avoid touching your mouth, face, eyes, or nose. Cough or sneeze into a tissue or your sleeve or elbow instead of into your hand or into the air.  Contact a health care provider if: You are getting worse instead of better. You have a fever or chills. Your mucus is brown or red. You have yellow or brown discharge coming from your nose. You have pain in your face, especially when you bend forward. You have swollen neck glands. You have pain while swallowing. You have white areas in the back of your throat. Get help right away if: You have shortness of breath that gets worse. You have severe or persistent: Headache. Ear pain. Sinus pain. Chest pain. You have chronic lung disease along with any of the following: Making high-pitched whistling sounds when you breathe, most often when you breathe out (wheezing). Prolonged cough (more than 14 days). Coughing up blood. A change in your usual mucus. You have a stiff neck. You have changes in your: Vision. Hearing. Thinking. Mood. These symptoms may be an emergency. Get help right away. Call 911. Do not wait to see if the symptoms will go away. Do not drive yourself to the hospital. Summary An upper respiratory infection (URI) is a common infection of the nose, throat, and upper air passages that lead to the lungs. A URI is caused by a virus. URIs usually get better on their own within 7-10 days. Medicines  cannot cure URIs, but your health care provider may recommend certain medicines to help relieve symptoms. This information is not intended to replace advice given to you by your health care provider. Make sure you discuss any questions you have with your health care provider. Document Revised: 10/06/2020 Document Reviewed: 10/06/2020 Elsevier Patient Education  2024 [employer].    If you have been instructed to have an in-person evaluation today at a local Urgent Care facility, please use  the link below. It will take you to a list of all of our available [facility], including address, phone number and hours of operation. Please do not delay care.  [facility]  If you or a family member do not have a primary care provider, use the link below to schedule a visit and establish care. When you choose a [facility] primary care physician or advanced practice provider, you gain a long-term partner in health. Find a Primary Care Provider  Learn more about [facility]'s in-office and virtual care options: [facility] - Get Care Now

## 2023-02-02 NOTE — Progress Notes (Signed)
Virtual Visit Consent   Morgan Moon, you are scheduled for a virtual visit with a [facility] provider today. Just as with appointments in the office, your consent must be obtained to participate. Your consent will be active for this visit and any virtual visit you may have with one of our providers in the next 365 days. If you have a MyChart account, a copy of this consent can be sent to you electronically.  As this is a virtual visit, video technology does not allow for your provider to perform a traditional examination. This may limit your provider's ability to fully assess your condition. If your provider identifies any concerns that need to be evaluated in person or the need to arrange testing (such as labs, EKG, etc.), we will make arrangements to do so. Although advances in technology are sophisticated, we cannot ensure that it will always work on either your end or our end. If the connection with a video visit is poor, the visit may have to be switched to a telephone visit. With either a video or telephone visit, we are not always able to ensure that we have a secure connection.  By engaging in this virtual visit, you consent to the provision of healthcare and authorize for your insurance to be billed (if applicable) for the services provided during this visit. Depending on your insurance coverage, you may receive a charge related to this service.  I need to obtain your verbal consent now. Are you willing to proceed with your visit today? Morgan Moon has provided verbal consent on 02/02/2023 for a virtual visit (video or telephone). Morgan Loveless, PA-C  Date: 02/02/2023 1:52 PM  Virtual Visit via Video Note   I, Morgan Moon, connected with  Morgan Moon  ([id number], [birth year]) on 02/02/23 at  1:45 PM EST by a video-enabled telemedicine application and verified that I am speaking with the correct person using two identifiers.  Location: Patient: Virtual Visit Location  Patient: Home Provider: Virtual Visit Location Provider: Home Office   I discussed the limitations of evaluation and management by telemedicine and the availability of in person appointments. The patient expressed understanding and agreed to proceed.    History of Present Illness: Morgan Moon is a 27 y.o. who identifies as a female who was assigned female at birth, and is being seen today for cough and congestion.  HPI: Cough This is a new problem. The current episode started 1 to 4 weeks ago. The problem has been gradually worsening. The problem occurs hourly. The cough is Productive of sputum and productive of purulent sputum. Associated symptoms include headaches, nasal congestion, postnasal drip, rhinorrhea and a sore throat. Pertinent negatives include no chills, ear congestion, ear pain, fever, myalgias, shortness of breath or wheezing. The symptoms are aggravated by lying down. Treatments tried: nyquil. The treatment provided no relief. There is no history of asthma, bronchitis, environmental allergies or pneumonia.     Problems:  Patient Active Problem List   Diagnosis Date Noted   Anemia 07/23/2017   Family history of breast cancer 07/23/2017   Hypertension 07/23/2017   DM (diabetes mellitus), type 1, uncontrolled 06/30/2015   Vitiligo 06/30/2015   Systolic murmur 06/30/2015    Allergies:  Allergies  Allergen Reactions   Amlodipine Other (See Comments)    headaches   Medications:  Current Outpatient Medications:    amoxicillin-clavulanate (AUGMENTIN) 875-125 MG tablet, Take 1 tablet by mouth 2 (two) times daily., Disp: 20 tablet, Rfl:  0   brompheniramine-pseudoephedrine-DM 30-2-10 MG/5ML syrup, Take 5 mLs by mouth 4 (four) times daily as needed., Disp: 120 mL, Rfl: 0   fluticasone (FLONASE) 50 MCG/ACT nasal spray, Place 2 sprays into both nostrils daily., Disp: 16 g, Rfl: 0   Accu-Chek FastClix Lancets MISC, USE TO TEST UP TO THREE TIMES DAILY AS DIRECTED, Disp: 300 each,  Rfl: 0   Blood Glucose Monitoring Suppl (ACCU-CHEK GUIDE ME) w/Device KIT, 1 kit by Does not apply route daily., Disp: 1 kit, Rfl: 0   Clindamycin-Benzoyl Per, Refr, gel, Apply 1 Application topically 2 (two) times daily., Disp: 45 g, Rfl: 0   Continuous Blood Gluc Receiver (FREESTYLE LIBRE 2 READER) DEVI, Use to check blood sugar TID. E10.649, Disp: 1 each, Rfl: 0   Continuous Blood Gluc Sensor (FREESTYLE LIBRE 2 SENSOR) MISC, Use to check blood sugar TID. Change sensors once every 14 days. E10.649, Disp: 2 each, Rfl: 3   glucose blood (ACCU-CHEK GUIDE) test strip, USE TO TEST BLOOD SUGAR LEVELS UP TO THREE TIMES DAILY AS NEEDED., Disp: 100 strip, Rfl: 2   ibuprofen (ADVIL) 600 MG tablet, Take 1 tablet (600 mg total) by mouth every 6 (six) hours as needed. Take with food., Disp: 30 tablet, Rfl: 0   Insulin Asp Prot & Asp FlexPen (NOVOLOG 70/30 MIX) (70-30) 100 UNIT/ML FlexPen, Inject 15 Units into the skin 2 (two) times daily., Disp: 30 mL, Rfl: 1   insulin aspart (NOVOLOG FLEXPEN) 100 UNIT/ML FlexPen, Inject 0-12 Units into the skin 3 (three) times daily with meals. Max dose 36 units. As per sliding scale., Disp: 30 mL, Rfl: 6   Insulin Pen Needle (BD PEN NEEDLE NANO 2ND GEN) 32G X 4 MM MISC, USE TO INJECT INSULIN TWICE DAILY, Disp: 100 each, Rfl: 2   Insulin Syringe-Needle U-100 (BD INSULIN SYRINGE ULTRAFINE) 31G X 15/64" 0.5 ML MISC, Use as directed twice daily., Disp: 300 each, Rfl: 3   Iron, Ferrous Sulfate, 325 (65 Fe) MG TABS, Take 325 mg by mouth daily., Disp: 60 tablet, Rfl: 6   lidocaine (XYLOCAINE) 2 % solution, USE AS DIRECTED 5 ML IN THE MOUTH OR THROAT EVERY 6 HOURS AS NEEDED FOR MOUTH PAIN AND SPIT OUT, Disp: 100 mL, Rfl: 0   lisinopril-hydrochlorothiazide (ZESTORETIC) 20-25 MG tablet, Take 1 tablet by mouth daily., Disp: 90 tablet, Rfl: 1   meloxicam (MOBIC) 7.5 MG tablet, TAKE 1 TABLET(7.5 MG) BY MOUTH DAILY, Disp: 30 tablet, Rfl: 1   metoprolol succinate (TOPROL-XL) 25 MG 24 hr  tablet, TAKE 1 TABLET(25 MG) BY MOUTH DAILY, Disp: 90 tablet, Rfl: 1   Misc. Devices MISC, Blood pressure monitor.  Diagnosis hypertension, Disp: 1 each, Rfl: 0   Nutritional Supplements (GLUCERNA 1.0 CAL) LIQD, Take 1 each by mouth 3 (three) times daily as needed. Strawberry or vanilla, Disp: 1500 mL, Rfl: 1   ondansetron (ZOFRAN-ODT) 4 MG disintegrating tablet, DISSOLVE 1 TABLET(4 MG) ON THE TONGUE EVERY 8 HOURS AS NEEDED FOR NAUSEA OR VOMITING, Disp: 30 tablet, Rfl: 1  Observations/Objective: Patient is well-developed, well-nourished in no acute distress.  Resting comfortably at home.  Head is normocephalic, atraumatic.  No labored breathing.  Speech is clear and coherent with logical content.  Patient is alert and oriented at baseline.    Assessment and Plan: 1. Bacterial upper respiratory infection - amoxicillin-clavulanate (AUGMENTIN) 875-125 MG tablet; Take 1 tablet by mouth 2 (two) times daily.  Dispense: 20 tablet; Refill: 0 - brompheniramine-pseudoephedrine-DM 30-2-10 MG/5ML syrup; Take 5 mLs by mouth  4 (four) times daily as needed.  Dispense: 120 mL; Refill: 0 - fluticasone (FLONASE) 50 MCG/ACT nasal spray; Place 2 sprays into both nostrils daily.  Dispense: 16 g; Refill: 0  - Worsening symptoms that have not responded to OTC medications.  - Will give Augmentin - Bromfed DM for cough - Flonase for nasal congestion and drainage - Continue allergy medications.  - Steam and humidifier can help - Stay well hydrated and get plenty of rest.  - Seek in person evaluation if no symptom improvement or if symptoms worsen   Follow Up Instructions: I discussed the assessment and treatment plan with the patient. The patient was provided an opportunity to ask questions and all were answered. The patient agreed with the plan and demonstrated an understanding of the instructions.  A copy of instructions were sent to the patient via MyChart unless otherwise noted below.    The patient was  advised to call back or seek an in-person evaluation if the symptoms worsen or if the condition fails to improve as anticipated.    Morgan Loveless, PA-C

## 2023-03-25 ENCOUNTER — Other Ambulatory Visit: Payer: Self-pay | Admitting: Family Medicine

## 2023-04-04 ENCOUNTER — Encounter: Payer: Self-pay | Admitting: Family Medicine

## 2023-04-05 ENCOUNTER — Other Ambulatory Visit: Payer: Self-pay | Admitting: Family Medicine

## 2023-04-05 MED ORDER — LIDOCAINE VISCOUS HCL 2 % MT SOLN: 15.0000 mL | Freq: Four times a day (QID) | OROMUCOSAL | 0 refills | Status: AC | PRN

## 2023-04-24 ENCOUNTER — Ambulatory Visit: Payer: Medicaid Other | Admitting: Family Medicine

## 2023-05-15 DIAGNOSIS — E109 Type 1 diabetes mellitus without complications: Secondary | ICD-10-CM | POA: Diagnosis not present

## 2023-05-15 DIAGNOSIS — H04123 Dry eye syndrome of bilateral lacrimal glands: Secondary | ICD-10-CM | POA: Diagnosis not present

## 2023-05-15 LAB — HM DIABETES EYE EXAM

## 2023-05-17 ENCOUNTER — Other Ambulatory Visit: Payer: Self-pay | Admitting: Family Medicine

## 2023-05-18 ENCOUNTER — Other Ambulatory Visit: Payer: Self-pay | Admitting: Family Medicine

## 2023-05-18 DIAGNOSIS — R000 Tachycardia, unspecified: Secondary | ICD-10-CM

## 2023-05-18 DIAGNOSIS — E1159 Type 2 diabetes mellitus with other circulatory complications: Secondary | ICD-10-CM

## 2023-05-20 ENCOUNTER — Other Ambulatory Visit: Payer: Self-pay | Admitting: Family Medicine

## 2023-05-20 DIAGNOSIS — R000 Tachycardia, unspecified: Secondary | ICD-10-CM

## 2023-06-06 ENCOUNTER — Encounter: Payer: Self-pay | Admitting: Family Medicine

## 2023-06-06 ENCOUNTER — Ambulatory Visit: Payer: Medicaid Other | Attending: Family Medicine | Admitting: Family Medicine

## 2023-06-06 VITALS — BP 94/73 | HR 70 | Ht 64.0 in | Wt 133.6 lb

## 2023-06-06 DIAGNOSIS — I10 Essential (primary) hypertension: Secondary | ICD-10-CM

## 2023-06-06 DIAGNOSIS — E1159 Type 2 diabetes mellitus with other circulatory complications: Secondary | ICD-10-CM

## 2023-06-06 DIAGNOSIS — E10649 Type 1 diabetes mellitus with hypoglycemia without coma: Secondary | ICD-10-CM | POA: Diagnosis not present

## 2023-06-06 DIAGNOSIS — Z1321 Encounter for screening for nutritional disorder: Secondary | ICD-10-CM

## 2023-06-06 DIAGNOSIS — R000 Tachycardia, unspecified: Secondary | ICD-10-CM

## 2023-06-06 DIAGNOSIS — Z794 Long term (current) use of insulin: Secondary | ICD-10-CM | POA: Diagnosis not present

## 2023-06-06 LAB — POCT GLYCOSYLATED HEMOGLOBIN (HGB A1C): HbA1c, POC (controlled diabetic range): 6.3 % (ref 0.0–7.0)

## 2023-06-06 MED ORDER — LISINOPRIL-HYDROCHLOROTHIAZIDE 10-12.5 MG PO TABS
1.0000 | ORAL_TABLET | Freq: Every day | ORAL | 1 refills | Status: DC
Start: 2023-06-06 — End: 2023-12-11

## 2023-06-06 MED ORDER — METOPROLOL SUCCINATE ER 25 MG PO TB24: ORAL_TABLET | ORAL | 1 refills | Status: DC

## 2023-06-06 MED ORDER — INSULIN ASP PROT & ASP FLEXPEN (70-30) 100 UNIT/ML ~~LOC~~ SUPN: 13.0000 [IU] | PEN_INJECTOR | Freq: Two times a day (BID) | SUBCUTANEOUS | 6 refills | Status: DC

## 2023-06-06 NOTE — Progress Notes (Signed)
 Subjective:  Patient ID: Morgan Moon, female    DOB: 1995-06-16  Age: 28 y.o. MRN: [id number]  CC: Medical Management of Chronic Issues (Check B-12 and Vit D levels/)     Discussed the use of AI scribe software for clinical note transcription with the patient, who gave verbal consent to proceed.  History of Present Illness The patient, with a history of hypertension, vitiligo, learning disability and type 1 diabetes, presents for a routine follow-up. She requested vitamin D and B12 levels to be checked due to a family history of deficiencies. She denies hypoglycemic episodes.  She has a blood pressure of 94/73, which is lower than her usual readings. She has been taking lisinopril hydrochlorothiazide 20/25 milligram and metoprolol 25 mg for hypertension.  She also takes Novolog 70/30 insulin 15 units twice daily for diabetes. She uses Novolog as needed, particularly after consuming cereal. She has previously used a continuous glucose monitor but discontinued it due to perceived inaccuracies in readings.  A1c is 6.3 today.    Past Medical History:  Diagnosis Date   Diabetes mellitus without complication (HCC)    Hypertension     No past surgical history on file.  Family History  Problem Relation Age of Onset   Hypertension Maternal Aunt    Hypertension Maternal Uncle    Hypertension Maternal Grandmother    Hypertension Maternal Grandfather     Social History   Socioeconomic History   Marital status: Single    Spouse name: Not on file   Number of children: Not on file   Years of education: Not on file   Highest education level: Not on file  Occupational History   Not on file  Tobacco Use   Smoking status: Never   Smokeless tobacco: Never  Substance and Sexual Activity   Alcohol use: No   Drug use: No   Sexual activity: Not on file  Other Topics Concern   Not on file  Social History Narrative   Not on file   Social Drivers of Health   Financial Resource  Strain: Not on file  Food Insecurity: No Food Insecurity (06/06/2023)   Hunger Vital Sign    Worried About Running Out of Food in the Last Year: Never true    Ran Out of Food in the Last Year: Never true  Transportation Needs: Not on file  Physical Activity: Not on file  Stress: Not on file  Social Connections: Not on file    Allergies  Allergen Reactions   Amlodipine Other (See Comments)    headaches    Outpatient Medications Prior to Visit  Medication Sig Dispense Refill   Accu-Chek FastClix Lancets MISC USE TO TEST UP TO THREE TIMES DAILY AS DIRECTED 300 each 0   ACCU-CHEK GUIDE TEST test strip USE TO TEST BLOOD SUGAR UP TO 3 TIMES DAILY AS NEEDED 100 strip 2   amoxicillin-clavulanate (AUGMENTIN) 875-125 MG tablet Take 1 tablet by mouth 2 (two) times daily. 20 tablet 0   Blood Glucose Monitoring Suppl (ACCU-CHEK GUIDE ME) w/Device KIT 1 kit by Does not apply route daily. 1 kit 0   brompheniramine-pseudoephedrine-DM 30-2-10 MG/5ML syrup Take 5 mLs by mouth 4 (four) times daily as needed. 120 mL 0   Clindamycin-Benzoyl Per, Refr, gel Apply 1 Application topically 2 (two) times daily. 45 g 0   Continuous Blood Gluc Receiver (FREESTYLE LIBRE 2 READER) DEVI Use to check blood sugar TID. N82.956 1 each 0   Continuous Blood Gluc Sensor (FREESTYLE  LIBRE 2 SENSOR) MISC Use to check blood sugar TID. Change sensors once every 14 days. E10.649 2 each 3   fluticasone (FLONASE) 50 MCG/ACT nasal spray Place 2 sprays into both nostrils daily. 16 g 0   ibuprofen (ADVIL) 600 MG tablet Take 1 tablet (600 mg total) by mouth every 6 (six) hours as needed. Take with food. 30 tablet 0   Insulin Asp Prot & Asp FlexPen (NOVOLOG 70/30 MIX) (70-30) 100 UNIT/ML FlexPen Inject 15 Units into the skin 2 (two) times daily. 30 mL 1   insulin aspart (NOVOLOG FLEXPEN) 100 UNIT/ML FlexPen Inject 0-12 Units into the skin 3 (three) times daily with meals. Max dose 36 units. As per sliding scale. 30 mL 6   Insulin Pen  Needle (BD PEN NEEDLE NANO 2ND GEN) 32G X 4 MM MISC USE TO INJECT INSULIN TWICE DAILY 100 each 0   Insulin Syringe-Needle U-100 (BD INSULIN SYRINGE ULTRAFINE) 31G X 15/64" 0.5 ML MISC Use as directed twice daily. 300 each 3   Iron, Ferrous Sulfate, 325 (65 Fe) MG TABS Take 325 mg by mouth daily. 60 tablet 6   lidocaine (XYLOCAINE) 2 % solution Use as directed 15 mLs in the mouth or throat every 6 (six) hours as needed for mouth pain. 100 mL 0   lisinopril-hydrochlorothiazide (ZESTORETIC) 20-25 MG tablet TAKE 1 TABLET BY MOUTH DAILY 30 tablet 0   meloxicam (MOBIC) 7.5 MG tablet TAKE 1 TABLET(7.5 MG) BY MOUTH DAILY 30 tablet 1   metoprolol succinate (TOPROL-XL) 25 MG 24 hr tablet TAKE 1 TABLET(25 MG) BY MOUTH DAILY 30 tablet 0   Misc. Devices MISC Blood pressure monitor.  Diagnosis hypertension 1 each 0   Nutritional Supplements (GLUCERNA 1.0 CAL) LIQD Take 1 each by mouth 3 (three) times daily as needed. Strawberry or vanilla 1500 mL 1   ondansetron (ZOFRAN-ODT) 4 MG disintegrating tablet DISSOLVE 1 TABLET(4 MG) ON THE TONGUE EVERY 8 HOURS AS NEEDED FOR NAUSEA OR VOMITING 30 tablet 1   No facility-administered medications prior to visit.     ROS Review of Systems  Constitutional:  Negative for activity change and appetite change.  HENT:  Negative for sinus pressure and sore throat.   Respiratory:  Negative for chest tightness, shortness of breath and wheezing.   Cardiovascular:  Negative for chest pain and palpitations.  Gastrointestinal:  Negative for abdominal distention, abdominal pain and constipation.  Genitourinary: Negative.   Musculoskeletal: Negative.   Psychiatric/Behavioral:  Negative for behavioral problems and dysphoric mood.     Objective:  BP 94/73   Pulse 70   Ht 5\' 4"  (1.626 m)   Wt 133 lb 9.6 oz (60.6 kg)   SpO2 100%   BMI 22.93 kg/m      06/06/2023    9:26 AM 10/17/2022   10:32 AM 04/18/2022    3:47 PM  BP/Weight  Systolic BP 94 110 132  Diastolic BP 73 75 84   Wt. (Lbs) 133.6 125 129.4  BMI 22.93 kg/m2 21.46 kg/m2 22.21 kg/m2      Physical Exam Constitutional:      Appearance: She is well-developed.  Cardiovascular:     Rate and Rhythm: Normal rate.     Heart sounds: Normal heart sounds. No murmur heard. Pulmonary:     Effort: Pulmonary effort is normal.     Breath sounds: Normal breath sounds. No wheezing or rales.  Chest:     Chest wall: No tenderness.  Abdominal:     General: Bowel sounds are  normal. There is no distension.     Palpations: Abdomen is soft. There is no mass.     Tenderness: There is no abdominal tenderness.  Musculoskeletal:        General: Normal range of motion.     Right lower leg: No edema.     Left lower leg: No edema.  Neurological:     Mental Status: She is alert and oriented to person, place, and time.  Psychiatric:        Mood and Affect: Mood normal.        Latest Ref Rng & Units 10/17/2022   11:44 AM 04/18/2022    4:20 PM 10/10/2021    4:45 PM  CMP  Glucose 70 - 99 mg/dL 045  409  811   BUN 6 - 20 mg/dL 12  11  14    Creatinine 0.57 - 1.00 mg/dL 9.14  7.82  9.56   Sodium 134 - 144 mmol/L 137  140  138   Potassium 3.5 - 5.2 mmol/L 4.9  4.3  5.0   Chloride 96 - 106 mmol/L 99  99  97   CO2 20 - 29 mmol/L 23  23  24    Calcium 8.7 - 10.2 mg/dL 21.3  9.6  9.9   Total Protein 6.0 - 8.5 g/dL 7.9  7.4  7.9   Total Bilirubin 0.0 - 1.2 mg/dL 0.3  <0.8  0.2   Alkaline Phos 44 - 121 IU/L 82  95  118   AST 0 - 40 IU/L 27  17  22    ALT 0 - 32 IU/L 17  11  15      Lipid Panel     Component Value Date/Time   CHOL 155 10/17/2022 1144   TRIG 48 10/17/2022 1144   HDL 55 10/17/2022 1144   CHOLHDL 2.5 03/24/2016 1120   VLDL 9 03/24/2016 1120   LDLCALC 90 10/17/2022 1144    CBC    Component Value Date/Time   WBC 3.8 10/17/2022 1144   WBC 4.9 06/27/2015 1220   RBC 3.93 10/17/2022 1144   RBC 5.54 (H) 06/27/2015 1220   HGB 10.4 (L) 10/17/2022 1144   HCT 31.9 (L) 10/17/2022 1144   PLT 234 10/17/2022  1144   MCV 81 10/17/2022 1144   MCH 26.5 (L) 10/17/2022 1144   MCH 17.3 (L) 06/27/2015 1220   MCHC 32.6 10/17/2022 1144   MCHC 29.1 (L) 06/27/2015 1220   RDW 13.2 10/17/2022 1144   LYMPHSABS 2.0 10/17/2022 1144   MONOABS 0.4 06/27/2015 1220   EOSABS 0.0 10/17/2022 1144   BASOSABS 0.0 10/17/2022 1144    Lab Results  Component Value Date   HGBA1C 6.3 10/17/2022       Assessment & Plan Hypertension Blood pressure is 94/73 mmHg, indicating hypotension.  - Decrease lisinopril-hydrochlorothiazide dose to 10/12.5 mg daily. - Prescribe new dose of lisinopril-hydrochlorothiazide 10/12.5mg , continue metoprolol - Instruct her to use a pill cutter to split current tablets if necessary.  Type 1 Diabetes Mellitus A1c is 6.3%, within target range.  She avoids continuous glucose monitoring due to perceived inaccuracy but acknowledges its benefit for low blood sugar alerts. - Decrease Novolog 70/30 dose to 13 units twice daily to prevent hypoglycemia - Monitor A1c and adjust insulin dose as needed based on results. -Counseled on Diabetic diet, my plate method, 657 minutes of moderate intensity exercise/week Blood sugar logs with fasting goals of 80-120 mg/dl, random of less than 846 and in the event of sugars less  than 60 mg/dl or greater than 409 mg/dl encouraged to notify the clinic. Advised on the need for annual eye exams, annual foot exams, Pneumonia vaccine.  Tachycardia -Currently on metoprolol  General Health Maintenance Requested vitamin D and B12 level checks due to family history of deficiency. - Order vitamin D and B12 level tests.      No orders of the defined types were placed in this encounter.   Follow-up: Return in about 6 months (around 12/07/2023) for Chronic medical conditions.       Hoy Register, MD, FAAFP. [facility] [place], [place] [phone]   06/06/2023, 9:45 AM

## 2023-06-06 NOTE — Patient Instructions (Signed)
 VISIT SUMMARY:  Today, we reviewed your hypertension and type 1 diabetes management. We also discussed your concerns about vitamin D and B12 levels due to your family history. Your blood pressure was lower than usual, and we made adjustments to your medication. We also adjusted your insulin dosage to better manage your diabetes and ordered the requested vitamin tests.  YOUR PLAN:  -HYPERTENSION: Hypertension, or high blood pressure, is when the force of the blood against your artery walls is too high. Your blood pressure today was lower than usual, so we reduced your lisinopril-hydrochlorothiazide dose to 10/12.5 mg daily. Please use a pill cutter to split your current tablets if necessary.  -TYPE 1 DIABETES MELLITUS: Type 1 diabetes is a condition where your body does not produce insulin. Your A1c level is within the target range, but we need to reduce the risk of low blood sugar. We decreased your Novolog 70/30 dose to 13 units twice daily. We will monitor your A1c and adjust your insulin dose as needed. Your metoprolol prescription has been refilled.  -GENERAL HEALTH MAINTENANCE: We ordered vitamin D and B12 level tests due to your family history of deficiencies. These tests will help [place] determine if you need supplements.  INSTRUCTIONS:  Please follow up with the lab to complete your vitamin D and B12 tests. We will communicate your A1c results and any necessary insulin adjustments via MyChart.

## 2023-06-08 ENCOUNTER — Encounter: Payer: Self-pay | Admitting: Family Medicine

## 2023-06-08 ENCOUNTER — Other Ambulatory Visit: Payer: Self-pay | Admitting: Family Medicine

## 2023-06-08 DIAGNOSIS — E538 Deficiency of other specified B group vitamins: Secondary | ICD-10-CM | POA: Insufficient documentation

## 2023-06-08 DIAGNOSIS — E559 Vitamin D deficiency, unspecified: Secondary | ICD-10-CM | POA: Insufficient documentation

## 2023-06-08 LAB — CMP14+EGFR
ALT: 23 IU/L (ref 0–32)
AST: 31 IU/L (ref 0–40)
Albumin: 4.6 g/dL (ref 4.0–5.0)
Alkaline Phosphatase: 104 IU/L (ref 44–121)
BUN/Creatinine Ratio: 11 (ref 9–23)
BUN: 9 mg/dL (ref 6–20)
Bilirubin Total: 0.3 mg/dL (ref 0.0–1.2)
CO2: 24 mmol/L (ref 20–29)
Calcium: 9.7 mg/dL (ref 8.7–10.2)
Chloride: 97 mmol/L (ref 96–106)
Creatinine, Ser: 0.81 mg/dL (ref 0.57–1.00)
Globulin, Total: 3 g/dL (ref 1.5–4.5)
Glucose: 94 mg/dL (ref 70–99)
Potassium: 4.6 mmol/L (ref 3.5–5.2)
Sodium: 135 mmol/L (ref 134–144)
Total Protein: 7.6 g/dL (ref 6.0–8.5)
eGFR: 102 mL/min/{1.73_m2} (ref >59)

## 2023-06-08 LAB — LP+NON-HDL CHOLESTEROL
Cholesterol, Total: 169 mg/dL (ref 100–199)
HDL: 63 mg/dL (ref >39)
LDL Chol Calc (NIH): 95 mg/dL (ref 0–99)
Total Non-HDL-Chol (LDL+VLDL): 106 mg/dL (ref 0–129)
Triglycerides: 55 mg/dL (ref 0–149)
VLDL Cholesterol Cal: 11 mg/dL (ref 5–40)

## 2023-06-08 LAB — VITAMIN D 25 HYDROXY (VIT D DEFICIENCY, FRACTURES): Vit D, 25-Hydroxy: 6.5 ng/mL — ABNORMAL LOW (ref 30.0–100.0)

## 2023-06-08 LAB — VITAMIN B12: Vitamin B-12: 179 pg/mL — ABNORMAL LOW (ref 232–1245)

## 2023-06-08 LAB — MICROALBUMIN / CREATININE URINE RATIO
Creatinine, Urine: 53.8 mg/dL
Microalb/Creat Ratio: 16 mg/g{creat} (ref 0–29)
Microalbumin, Urine: 8.8 ug/mL

## 2023-06-08 MED ORDER — ERGOCALCIFEROL 1.25 MG (50000 UT) PO CAPS: 50000.0000 [IU] | ORAL_CAPSULE | ORAL | 0 refills | Status: AC

## 2023-06-08 MED ORDER — CYANOCOBALAMIN 1000 MCG PO CAPS: 1.0000 | ORAL_CAPSULE | Freq: Every day | ORAL | 1 refills | Status: AC

## 2023-06-19 ENCOUNTER — Encounter: Payer: Self-pay | Admitting: Family Medicine

## 2023-08-27 ENCOUNTER — Encounter: Payer: Self-pay | Admitting: Family Medicine

## 2023-08-29 ENCOUNTER — Other Ambulatory Visit: Payer: Self-pay | Admitting: Family Medicine

## 2023-08-29 ENCOUNTER — Encounter: Payer: Self-pay | Admitting: Family Medicine

## 2023-09-06 ENCOUNTER — Other Ambulatory Visit: Payer: Self-pay | Admitting: Family Medicine

## 2023-09-06 ENCOUNTER — Other Ambulatory Visit: Payer: Self-pay

## 2023-09-06 DIAGNOSIS — E109 Type 1 diabetes mellitus without complications: Secondary | ICD-10-CM

## 2023-09-06 MED ORDER — ACCU-CHEK FASTCLIX LANCETS MISC
0 refills | Status: AC
Start: 2023-09-06 — End: ?

## 2023-09-06 MED ORDER — ACCU-CHEK GUIDE TEST VI STRP: ORAL_STRIP | 2 refills | Status: AC

## 2023-09-06 MED ORDER — ACCU-CHEK GUIDE W/DEVICE KIT: PACK | 0 refills | Status: AC

## 2023-09-27 ENCOUNTER — Other Ambulatory Visit: Payer: Self-pay | Admitting: Family Medicine

## 2023-10-01 ENCOUNTER — Encounter: Payer: Self-pay | Admitting: Family Medicine

## 2023-10-05 ENCOUNTER — Other Ambulatory Visit: Payer: Self-pay | Admitting: Family Medicine

## 2023-10-11 ENCOUNTER — Other Ambulatory Visit: Payer: Self-pay | Admitting: Family Medicine

## 2023-10-11 ENCOUNTER — Encounter: Payer: Self-pay | Admitting: Family Medicine

## 2023-11-01 ENCOUNTER — Encounter: Payer: Self-pay | Admitting: Family Medicine

## 2023-11-01 ENCOUNTER — Telehealth: Admitting: Emergency Medicine

## 2023-11-01 ENCOUNTER — Telehealth

## 2023-11-01 DIAGNOSIS — R3989 Other symptoms and signs involving the genitourinary system: Secondary | ICD-10-CM

## 2023-11-01 MED ORDER — CEPHALEXIN 500 MG PO CAPS: 500.0000 mg | ORAL_CAPSULE | Freq: Two times a day (BID) | ORAL | 0 refills | Status: AC

## 2023-11-01 NOTE — Patient Instructions (Signed)
 Morgan Moon, thank you for joining [name], NP for today's virtual visit.  While this provider is not your primary care provider (PCP), if your PCP is located in our provider database this encounter information will be shared with them immediately following your visit.   A [facility] MyChart account gives you access to today's visit and all your visits, tests, and labs performed at [facility]  click here if you don't have a [facility] MyChart account or go to [email or web address]  Consent: (Patient) Morgan Moon provided verbal consent for this virtual visit at the beginning of the encounter.  Current Medications:  Current Outpatient Medications:    cephALEXin  (KEFLEX ) 500 MG capsule, Take 1 capsule (500 mg total) by mouth 2 (two) times daily for 7 days., Disp: 14 capsule, Rfl: 0   Accu-Chek FastClix Lancets MISC, USE TO TEST UP TO THREE TIMES DAILY AS DIRECTED, Disp: 204 each, Rfl: 0   Blood Glucose Monitoring Suppl (ACCU-CHEK GUIDE) w/Device KIT, Use to test blood sugar, Disp: 1 kit, Rfl: 0   brompheniramine-pseudoephedrine-DM 30-2-10 MG/5ML syrup, Take 5 mLs by mouth 4 (four) times daily as needed., Disp: 120 mL, Rfl: 0   Clindamycin -Benzoyl Per, Refr, gel, Apply 1 Application topically 2 (two) times daily., Disp: 45 g, Rfl: 0   Continuous Blood Gluc Receiver (FREESTYLE LIBRE 2 READER) DEVI, Use to check blood sugar TID. E10.649, Disp: 1 each, Rfl: 0   Continuous Blood Gluc Sensor (FREESTYLE LIBRE 2 SENSOR) MISC, Use to check blood sugar TID. Change sensors once every 14 days. E10.649, Disp: 2 each, Rfl: 3   Cyanocobalamin  1000 MCG CAPS, Take 1 capsule by mouth daily., Disp: 90 capsule, Rfl: 1   DROPLET PEN NEEDLES 32G X 4 MM MISC, USE TO INJECT INSULIN  TWICE DAILY, Disp: 100 each, Rfl: 0   ergocalciferol  (DRISDOL ) 1.25 MG (50000 UT) capsule, Take 1 capsule (50,000 Units total) by mouth once a week., Disp: 12 capsule, Rfl: 0   fluticasone  (FLONASE ) 50 MCG/ACT  nasal spray, Place 2 sprays into both nostrils daily., Disp: 16 g, Rfl: 0   glucose blood (ACCU-CHEK GUIDE TEST) test strip, Use as instructed, Disp: 100 strip, Rfl: 2   ibuprofen  (ADVIL ) 600 MG tablet, Take 1 tablet (600 mg total) by mouth every 6 (six) hours as needed. Take with food., Disp: 30 tablet, Rfl: 0   Insulin  Asp Prot & Asp FlexPen (NOVOLOG  70/30 MIX) (70-30) 100 UNIT/ML FlexPen, Inject 13 Units into the skin 2 (two) times daily., Disp: 30 mL, Rfl: 6   insulin  aspart (NOVOLOG  FLEXPEN) 100 UNIT/ML FlexPen, Inject 0-12 Units into the skin 3 (three) times daily with meals. Max dose 36 units. As per sliding scale., Disp: 30 mL, Rfl: 6   Insulin  Syringe-Needle U-100 (BD INSULIN  SYRINGE ULTRAFINE) 31G X 15/64 0.5 ML MISC, Use as directed twice daily., Disp: 300 each, Rfl: 3   Iron , Ferrous Sulfate , 325 (65 Fe) MG TABS, Take 325 mg by mouth daily., Disp: 60 tablet, Rfl: 6   lidocaine  (XYLOCAINE ) 2 % solution, Use as directed 15 mLs in the mouth or throat every 6 (six) hours as needed for mouth pain., Disp: 100 mL, Rfl: 0   lisinopril -hydrochlorothiazide  (ZESTORETIC ) 10-12.5 MG tablet, Take 1 tablet by mouth daily., Disp: 90 tablet, Rfl: 1   meloxicam  (MOBIC ) 7.5 MG tablet, TAKE 1 TABLET(7.5 MG) BY MOUTH DAILY, Disp: 30 tablet, Rfl: 1   metoprolol  succinate (TOPROL -XL) 25 MG 24 hr tablet, TAKE 1 TABLET(25 MG) BY MOUTH DAILY,  Disp: 90 tablet, Rfl: 1   Misc. Devices MISC, Blood pressure monitor.  Diagnosis hypertension, Disp: 1 each, Rfl: 0   Nutritional Supplements (GLUCERNA 1.0 CAL) LIQD, Take 1 each by mouth 3 (three) times daily as needed. Strawberry or vanilla, Disp: 1500 mL, Rfl: 1   ondansetron  (ZOFRAN -ODT) 4 MG disintegrating tablet, DISSOLVE 1 TABLET(4 MG) ON THE TONGUE EVERY 8 HOURS AS NEEDED FOR NAUSEA OR VOMITING, Disp: 30 tablet, Rfl: 1   Medications ordered in this encounter:  Meds ordered this encounter  Medications   cephALEXin  (KEFLEX ) 500 MG capsule    Sig: Take 1 capsule (500  mg total) by mouth 2 (two) times daily for 7 days.    Dispense:  14 capsule    Refill:  0     *If you need refills on other medications prior to your next appointment, please contact your pharmacy*  Follow-Up: Call back or seek an in-person evaluation if the symptoms worsen or if the condition fails to improve as anticipated.  [facility] [phone]  Other Instructions  Be careful drinking juice -it can raise your blood sugar. Try diluting it with water if you choose to continue drinking it and stop drinking it when you are feeling better.   If you are not getting better with the antibiotics or if you are feeling worse, you will need to be checked in person as you may need tests of your urine.    If you have been instructed to have an in-person evaluation today at a local Urgent Care facility, please use the link below. It will take you to a list of all of our available [facility], including address, phone number and hours of operation. Please do not delay care.  [facility]  If you or a family member do not have a primary care provider, use the link below to schedule a visit and establish care. When you choose a [facility] primary care physician or advanced practice provider, you gain a long-term partner in health. Find a Primary Care Provider  Learn more about [facility]'s in-office and virtual care options: [facility] - Get Care Now

## 2023-11-01 NOTE — Progress Notes (Signed)
 Virtual Visit Consent   Morgan Moon, you are scheduled for a virtual visit with a [facility] provider today. Just as with appointments in the office, your consent must be obtained to participate. Your consent will be active for this visit and any virtual visit you may have with one of our providers in the next 365 days. If you have a MyChart account, a copy of this consent can be sent to you electronically.  As this is a virtual visit, video technology does not allow for your provider to perform a traditional examination. This may limit your provider's ability to fully assess your condition. If your provider identifies any concerns that need to be evaluated in person or the need to arrange testing (such as labs, EKG, etc.), we will make arrangements to do so. Although advances in technology are sophisticated, we cannot ensure that it will always work on either your end or our end. If the connection with a video visit is poor, the visit may have to be switched to a telephone visit. With either a video or telephone visit, we are not always able to ensure that we have a secure connection.  By engaging in this virtual visit, you consent to the provision of healthcare and authorize for your insurance to be billed (if applicable) for the services provided during this visit. Depending on your insurance coverage, you may receive a charge related to this service.  I need to obtain your verbal consent now. Are you willing to proceed with your visit today? Morgan Moon has provided verbal consent on 11/01/2023 for a virtual visit (video or telephone). Morgan CHRISTELLA Belt, NP  Date: 11/01/2023 8:18 AM   Virtual Visit via Video Note   I, Morgan Moon, connected with  Morgan Moon  ([id number], 12/15/1995) on 11/01/23 at  8:15 AM EDT by a video-enabled telemedicine application and verified that I am speaking with the correct person using two identifiers.  Location: Patient: Virtual Visit Location Patient:  Other: sister's house in [place] Provider: Virtual Visit Location Provider: Home Office   I discussed the limitations of evaluation and management by telemedicine and the availability of in person appointments. The patient expressed understanding and agreed to proceed.    History of Present Illness: Morgan Moon is a 28 y.o. who identifies as a female who was assigned female at birth, and is being seen today for possible UTI. Has never had one before. Yesterday started having urinary frequency and urgency and when she pees, it's only a small amount. Has a cramping feeling before she pees. It burns when she pees. Did see some blood once yesterday but isn't sure if it was some vaginal spotting or from her urinary tract. Only happened once. Denies n/v and flank pain, no fever/chills. She is drinking cranberry juice for little relief. The juice has not made her sugars worse, she says  HPI: HPI  Problems:  Patient Active Problem List   Diagnosis Date Noted   Vitamin D  deficiency 06/08/2023   B12 deficiency 06/08/2023   Anemia 07/23/2017   Family history of breast cancer 07/23/2017   Hypertension 07/23/2017   DM (diabetes mellitus), type 1, uncontrolled 06/30/2015   Vitiligo 06/30/2015   Systolic murmur 06/30/2015    Allergies:  Allergies  Allergen Reactions   Amlodipine  Other (See Comments)    headaches   Medications:  Current Outpatient Medications:    cephALEXin  (KEFLEX ) 500 MG capsule, Take 1 capsule (500 mg total) by mouth 2 (two)  times daily for 7 days., Disp: 14 capsule, Rfl: 0   Accu-Chek FastClix Lancets MISC, USE TO TEST UP TO THREE TIMES DAILY AS DIRECTED, Disp: 204 each, Rfl: 0   Blood Glucose Monitoring Suppl (ACCU-CHEK GUIDE) w/Device KIT, Use to test blood sugar, Disp: 1 kit, Rfl: 0   brompheniramine-pseudoephedrine-DM 30-2-10 MG/5ML syrup, Take 5 mLs by mouth 4 (four) times daily as needed., Disp: 120 mL, Rfl: 0   Clindamycin -Benzoyl Per, Refr, gel, Apply 1 Application  topically 2 (two) times daily., Disp: 45 g, Rfl: 0   Continuous Blood Gluc Receiver (FREESTYLE LIBRE 2 READER) DEVI, Use to check blood sugar TID. E10.649, Disp: 1 each, Rfl: 0   Continuous Blood Gluc Sensor (FREESTYLE LIBRE 2 SENSOR) MISC, Use to check blood sugar TID. Change sensors once every 14 days. E10.649, Disp: 2 each, Rfl: 3   Cyanocobalamin  1000 MCG CAPS, Take 1 capsule by mouth daily., Disp: 90 capsule, Rfl: 1   DROPLET PEN NEEDLES 32G X 4 MM MISC, USE TO INJECT INSULIN  TWICE DAILY, Disp: 100 each, Rfl: 0   ergocalciferol  (DRISDOL ) 1.25 MG (50000 UT) capsule, Take 1 capsule (50,000 Units total) by mouth once a week., Disp: 12 capsule, Rfl: 0   fluticasone  (FLONASE ) 50 MCG/ACT nasal spray, Place 2 sprays into both nostrils daily., Disp: 16 g, Rfl: 0   glucose blood (ACCU-CHEK GUIDE TEST) test strip, Use as instructed, Disp: 100 strip, Rfl: 2   ibuprofen  (ADVIL ) 600 MG tablet, Take 1 tablet (600 mg total) by mouth every 6 (six) hours as needed. Take with food., Disp: 30 tablet, Rfl: 0   Insulin  Asp Prot & Asp FlexPen (NOVOLOG  70/30 MIX) (70-30) 100 UNIT/ML FlexPen, Inject 13 Units into the skin 2 (two) times daily., Disp: 30 mL, Rfl: 6   insulin  aspart (NOVOLOG  FLEXPEN) 100 UNIT/ML FlexPen, Inject 0-12 Units into the skin 3 (three) times daily with meals. Max dose 36 units. As per sliding scale., Disp: 30 mL, Rfl: 6   Insulin  Syringe-Needle U-100 (BD INSULIN  SYRINGE ULTRAFINE) 31G X 15/64 0.5 ML MISC, Use as directed twice daily., Disp: 300 each, Rfl: 3   Iron , Ferrous Sulfate , 325 (65 Fe) MG TABS, Take 325 mg by mouth daily., Disp: 60 tablet, Rfl: 6   lidocaine  (XYLOCAINE ) 2 % solution, Use as directed 15 mLs in the mouth or throat every 6 (six) hours as needed for mouth pain., Disp: 100 mL, Rfl: 0   lisinopril -hydrochlorothiazide  (ZESTORETIC ) 10-12.5 MG tablet, Take 1 tablet by mouth daily., Disp: 90 tablet, Rfl: 1   meloxicam  (MOBIC ) 7.5 MG tablet, TAKE 1 TABLET(7.5 MG) BY MOUTH DAILY,  Disp: 30 tablet, Rfl: 1   metoprolol  succinate (TOPROL -XL) 25 MG 24 hr tablet, TAKE 1 TABLET(25 MG) BY MOUTH DAILY, Disp: 90 tablet, Rfl: 1   Misc. Devices MISC, Blood pressure monitor.  Diagnosis hypertension, Disp: 1 each, Rfl: 0   Nutritional Supplements (GLUCERNA 1.0 CAL) LIQD, Take 1 each by mouth 3 (three) times daily as needed. Strawberry or vanilla, Disp: 1500 mL, Rfl: 1   ondansetron  (ZOFRAN -ODT) 4 MG disintegrating tablet, DISSOLVE 1 TABLET(4 MG) ON THE TONGUE EVERY 8 HOURS AS NEEDED FOR NAUSEA OR VOMITING, Disp: 30 tablet, Rfl: 1  Observations/Objective: Patient is well-developed, well-nourished in no acute distress.  Resting comfortably  at sister's house.  Head is normocephalic, atraumatic.  No labored breathing.  Speech is clear and coherent with logical content.  Patient is alert and oriented at baseline.    Assessment and Plan: 1. Suspected UTI (Primary)  Sx c/w uti. Will treat with keflex . D/c juice or dilute with water, d/c completely when feeling better.   Follow Up Instructions: I discussed the assessment and treatment plan with the patient. The patient was provided an opportunity to ask questions and all were answered. The patient agreed with the plan and demonstrated an understanding of the instructions.  A copy of instructions were sent to the patient via MyChart unless otherwise noted below.   The patient was advised to call back or seek an in-person evaluation if the symptoms worsen or if the condition fails to improve as anticipated.    Morgan CHRISTELLA Belt, NP

## 2023-11-02 ENCOUNTER — Other Ambulatory Visit: Payer: Self-pay | Admitting: Family Medicine

## 2023-11-02 DIAGNOSIS — E10649 Type 1 diabetes mellitus with hypoglycemia without coma: Secondary | ICD-10-CM

## 2023-11-20 ENCOUNTER — Encounter: Payer: Self-pay | Admitting: Family Medicine

## 2023-11-21 ENCOUNTER — Other Ambulatory Visit: Payer: Self-pay | Admitting: Family Medicine

## 2023-11-21 MED ORDER — AMOXICILLIN 500 MG PO CAPS: 500.0000 mg | ORAL_CAPSULE | Freq: Three times a day (TID) | ORAL | 0 refills | Status: AC

## 2023-11-21 MED ORDER — IBUPROFEN 600 MG PO TABS: 600.0000 mg | ORAL_TABLET | Freq: Three times a day (TID) | ORAL | 0 refills | Status: AC | PRN

## 2023-12-11 ENCOUNTER — Other Ambulatory Visit: Payer: Self-pay | Admitting: Family Medicine

## 2023-12-11 DIAGNOSIS — E10649 Type 1 diabetes mellitus with hypoglycemia without coma: Secondary | ICD-10-CM

## 2023-12-12 ENCOUNTER — Ambulatory Visit: Admitting: Family Medicine

## 2023-12-26 ENCOUNTER — Ambulatory Visit: Admitting: Family Medicine

## 2024-01-15 ENCOUNTER — Other Ambulatory Visit: Payer: Self-pay | Admitting: Family Medicine

## 2024-01-22 ENCOUNTER — Other Ambulatory Visit: Payer: Self-pay | Admitting: Family Medicine

## 2024-01-23 ENCOUNTER — Other Ambulatory Visit: Payer: Self-pay | Admitting: Family Medicine

## 2024-01-30 ENCOUNTER — Ambulatory Visit: Admitting: Family Medicine

## 2024-02-01 ENCOUNTER — Other Ambulatory Visit: Payer: Self-pay | Admitting: Family Medicine

## 2024-02-01 DIAGNOSIS — R000 Tachycardia, unspecified: Secondary | ICD-10-CM

## 2024-02-01 DIAGNOSIS — E10649 Type 1 diabetes mellitus with hypoglycemia without coma: Secondary | ICD-10-CM

## 2024-02-28 ENCOUNTER — Encounter: Payer: Self-pay | Admitting: Family Medicine

## 2024-02-29 ENCOUNTER — Other Ambulatory Visit: Payer: Self-pay | Admitting: Family Medicine

## 2024-03-05 ENCOUNTER — Telehealth: Payer: Self-pay | Admitting: Family Medicine

## 2024-03-05 NOTE — Telephone Encounter (Signed)
 Pt confirmed appt 03/05/2024

## 2024-03-06 ENCOUNTER — Ambulatory Visit: Attending: Family Medicine | Admitting: Family Medicine

## 2024-03-06 VITALS — BP 125/73 | HR 81 | Temp 98.8°F | Ht 64.0 in | Wt 136.8 lb

## 2024-03-06 DIAGNOSIS — I152 Hypertension secondary to endocrine disorders: Secondary | ICD-10-CM | POA: Diagnosis not present

## 2024-03-06 DIAGNOSIS — E1069 Type 1 diabetes mellitus with other specified complication: Secondary | ICD-10-CM

## 2024-03-06 DIAGNOSIS — I110 Hypertensive heart disease with heart failure: Secondary | ICD-10-CM

## 2024-03-06 DIAGNOSIS — E119 Type 2 diabetes mellitus without complications: Secondary | ICD-10-CM | POA: Diagnosis not present

## 2024-03-06 DIAGNOSIS — E1159 Type 2 diabetes mellitus with other circulatory complications: Secondary | ICD-10-CM

## 2024-03-06 DIAGNOSIS — Z794 Long term (current) use of insulin: Secondary | ICD-10-CM | POA: Diagnosis not present

## 2024-03-06 DIAGNOSIS — E10649 Type 1 diabetes mellitus with hypoglycemia without coma: Secondary | ICD-10-CM

## 2024-03-06 LAB — POCT GLYCOSYLATED HEMOGLOBIN (HGB A1C): HbA1c, POC (controlled diabetic range): 7.3 % — AB (ref 0.0–7.0)

## 2024-03-06 MED ORDER — INSULIN ASP PROT & ASP FLEXPEN (70-30) 100 UNIT/ML ~~LOC~~ SUPN: 13.0000 [IU] | PEN_INJECTOR | Freq: Two times a day (BID) | SUBCUTANEOUS | 6 refills | Status: AC

## 2024-03-06 MED ORDER — LISINOPRIL-HYDROCHLOROTHIAZIDE 10-12.5 MG PO TABS: 1.0000 | ORAL_TABLET | Freq: Every day | ORAL | 1 refills | Status: AC

## 2024-03-06 NOTE — Progress Notes (Signed)
 Subjective:  Patient ID: Morgan Moon, female    DOB: 01/18/1996  Age: 28 y.o. MRN: [id number]  CC: Medical Management of Chronic Issues     Discussed the use of AI scribe software for clinical note transcription with the patient, who gave verbal consent to proceed.  History of Present Illness Morgan Moon is a 28 year old female with hypertension, vitiligo, learning disability and type 1 diabetes  who presents for a routine follow-up and foot exam.  She adheres to amlodipine , metoprolol , and lisinopril -hydrochlorothiazide  for hypertension and Novolog  70/30 insulin  13 units twice daily for diabetes. Her A1c increased from 6.3% to 7.3%. She has occasional hypoglycemia when she skips meals, especially if out all day, and sometimes carries a snack to prevent this. She denies numbness or tingling in her feet and reports no urinary issues. She occasionally forgets to eat breakfast.    Past Medical History:  Diagnosis Date   Diabetes mellitus without complication (HCC)    Hypertension     No past surgical history on file.  Family History  Problem Relation Age of Onset   Hypertension Maternal Aunt    Hypertension Maternal Uncle    Hypertension Maternal Grandmother    Hypertension Maternal Grandfather     Social History   Socioeconomic History   Marital status: Single    Spouse name: Not on file   Number of children: Not on file   Years of education: Not on file   Highest education level: Not on file  Occupational History   Not on file  Tobacco Use   Smoking status: Never   Smokeless tobacco: Never  Substance and Sexual Activity   Alcohol use: No   Drug use: No   Sexual activity: Not on file  Other Topics Concern   Not on file  Social History Narrative   Not on file   Social Drivers of Health   Tobacco Use: Low Risk (06/06/2023)   Patient History    Smoking Tobacco Use: Never    Smokeless Tobacco Use: Never    Passive Exposure: Not on file  Financial  Resource Strain: Not on file  Food Insecurity: No Food Insecurity (06/06/2023)   Hunger Vital Sign    Worried About Running Out of Food in the Last Year: Never true    Ran Out of Food in the Last Year: Never true  Transportation Needs: Not on file  Physical Activity: Not on file  Stress: Not on file  Social Connections: Not on file  Depression (PHQ2-9): Low Risk (03/06/2024)   Depression (PHQ2-9)    PHQ-2 Score: 0  Alcohol Screen: Not on file  Housing: Unknown (06/06/2023)   Housing Stability Vital Sign    Unable to Pay for Housing in the Last Year: No    Number of Times Moved in the Last Year: Not on file    Homeless in the Last Year: No  Utilities: Not on file  Health Literacy: Not on file    Allergies[1]  Outpatient Medications Prior to Visit  Medication Sig Dispense Refill   Accu-Chek FastClix Lancets MISC USE TO TEST UP TO THREE TIMES DAILY AS DIRECTED 204 each 0   Blood Glucose Monitoring Suppl (ACCU-CHEK GUIDE) w/Device KIT Use to test blood sugar 1 kit 0   brompheniramine-pseudoephedrine-DM 30-2-10 MG/5ML syrup Take 5 mLs by mouth 4 (four) times daily as needed. 120 mL 0   Clindamycin -Benzoyl Per, Refr, gel Apply 1 Application topically 2 (two) times daily. 45 g 0  Cyanocobalamin  1000 MCG CAPS Take 1 capsule by mouth daily. 90 capsule 1   ergocalciferol  (DRISDOL ) 1.25 MG (50000 UT) capsule Take 1 capsule (50,000 Units total) by mouth once a week. 12 capsule 0   fluticasone  (FLONASE ) 50 MCG/ACT nasal spray Place 2 sprays into both nostrils daily. 16 g 0   glucose blood (ACCU-CHEK GUIDE TEST) test strip Use as instructed 100 strip 2   ibuprofen  (ADVIL ) 600 MG tablet Take 1 tablet (600 mg total) by mouth every 8 (eight) hours as needed. Take with food. 30 tablet 0   Insulin  Aspart FlexPen (NOVOLOG ) 100 UNIT/ML ADMINISTER 0 TO 12 UNITS UNDER THE SKIN THREE TIMES DAILY WITH MEALS. MAX DOSE 36 UNITS. AS PER SLIDING SCALE 15 mL 0   Insulin  Pen Needle (DROPLET PEN NEEDLES) 32G X 4  MM MISC USE TO INJECT INSULIN  A TOTAL OF 5 TIMES DAILY 100 each 0   Insulin  Syringe-Needle U-100 (BD INSULIN  SYRINGE ULTRAFINE) 31G X 15/64 0.5 ML MISC Use as directed twice daily. 300 each 3   Iron , Ferrous Sulfate , 325 (65 Fe) MG TABS Take 325 mg by mouth daily. 60 tablet 6   lidocaine  (XYLOCAINE ) 2 % solution Use as directed 15 mLs in the mouth or throat every 6 (six) hours as needed for mouth pain. 100 mL 0   metoprolol  succinate (TOPROL -XL) 25 MG 24 hr tablet TAKE 1 TABLET(25 MG) BY MOUTH DAILY 90 tablet 1   Misc. Devices MISC Blood pressure monitor.  Diagnosis hypertension 1 each 0   Nutritional Supplements (GLUCERNA 1.0 CAL) LIQD Take 1 each by mouth 3 (three) times daily as needed. Strawberry or vanilla 1500 mL 1   ondansetron  (ZOFRAN -ODT) 4 MG disintegrating tablet DISSOLVE 1 TABLET(4 MG) ON THE TONGUE EVERY 8 HOURS AS NEEDED FOR NAUSEA OR VOMITING 30 tablet 1   Insulin  Asp Prot & Asp FlexPen (NOVOLOG  70/30 MIX) (70-30) 100 UNIT/ML FlexPen Inject 13 Units into the skin 2 (two) times daily. 30 mL 6   lisinopril -hydrochlorothiazide  (ZESTORETIC ) 10-12.5 MG tablet Take 1 tablet by mouth daily. 90 tablet 0   Continuous Blood Gluc Receiver (FREESTYLE LIBRE 2 READER) DEVI Use to check blood sugar TID. E10.649 (Patient not taking: Reported on 03/06/2024) 1 each 0   Continuous Blood Gluc Sensor (FREESTYLE LIBRE 2 SENSOR) MISC Use to check blood sugar TID. Change sensors once every 14 days. E10.649 (Patient not taking: Reported on 03/06/2024) 2 each 3   No facility-administered medications prior to visit.     ROS Review of Systems  Constitutional:  Negative for activity change and appetite change.  HENT:  Negative for sinus pressure and sore throat.   Respiratory:  Negative for chest tightness, shortness of breath and wheezing.   Cardiovascular:  Negative for chest pain and palpitations.  Gastrointestinal:  Negative for abdominal distention, abdominal pain and constipation.  Genitourinary:  Negative.   Musculoskeletal: Negative.   Psychiatric/Behavioral:  Negative for behavioral problems and dysphoric mood.     Objective:  BP 125/73   Pulse 81   Temp 98.8 F (37.1 C) (Oral)   Ht 5' 4 (1.626 m)   Wt 136 lb 12.8 oz (62.1 kg)   SpO2 100%   BMI 23.48 kg/m      03/06/2024    8:39 AM 06/06/2023    9:26 AM 10/17/2022   10:32 AM  BP/Weight  Systolic BP 125 94 110  Diastolic BP 73 73 75  Wt. (Lbs) 136.8 133.6 125  BMI 23.48 kg/m2 22.93 kg/m2 21.46 kg/m2  Physical Exam Constitutional:      Appearance: She is well-developed.  Cardiovascular:     Rate and Rhythm: Normal rate.     Heart sounds: Normal heart sounds. No murmur heard. Pulmonary:     Effort: Pulmonary effort is normal.     Breath sounds: Normal breath sounds. No wheezing or rales.  Chest:     Chest wall: No tenderness.  Abdominal:     General: Bowel sounds are normal. There is no distension.     Palpations: Abdomen is soft. There is no mass.     Tenderness: There is no abdominal tenderness.  Musculoskeletal:        General: Normal range of motion.     Right lower leg: No edema.     Left lower leg: No edema.  Neurological:     Mental Status: She is alert and oriented to person, place, and time.  Psychiatric:        Mood and Affect: Mood normal.    Diabetic Foot Exam - Simple   Simple Foot Form Diabetic Foot exam was performed with the following findings: Yes 03/06/2024  8:51 AM  Visual Inspection No deformities, no ulcerations, no other skin breakdown bilaterally: Yes Sensation Testing Intact to touch and monofilament testing bilaterally: Yes Pulse Check Posterior Tibialis and Dorsalis pulse intact bilaterally: Yes Comments        Latest Ref Rng & Units 06/06/2023   10:07 AM 10/17/2022   11:44 AM 04/18/2022    4:20 PM  CMP  Glucose 70 - 99 mg/dL 94  883  844   BUN 6 - 20 mg/dL 9  12  11    Creatinine 0.57 - 1.00 mg/dL 9.18  9.02  9.28   Sodium 134 - 144 mmol/L 135  137  140    Potassium 3.5 - 5.2 mmol/L 4.6  4.9  4.3   Chloride 96 - 106 mmol/L 97  99  99   CO2 20 - 29 mmol/L 24  23  23    Calcium 8.7 - 10.2 mg/dL 9.7  89.8  9.6   Total Protein 6.0 - 8.5 g/dL 7.6  7.9  7.4   Total Bilirubin 0.0 - 1.2 mg/dL 0.3  0.3  <9.7   Alkaline Phos 44 - 121 IU/L 104  82  95   AST 0 - 40 IU/L 31  27  17    ALT 0 - 32 IU/L 23  17  11      Lipid Panel     Component Value Date/Time   CHOL 169 06/06/2023 1007   TRIG 55 06/06/2023 1007   HDL 63 06/06/2023 1007   CHOLHDL 2.5 03/24/2016 1120   VLDL 9 03/24/2016 1120   LDLCALC 95 06/06/2023 1007    CBC    Component Value Date/Time   WBC 3.8 10/17/2022 1144   WBC 4.9 06/27/2015 1220   RBC 3.93 10/17/2022 1144   RBC 5.54 (H) 06/27/2015 1220   HGB 10.4 (L) 10/17/2022 1144   HCT 31.9 (L) 10/17/2022 1144   PLT 234 10/17/2022 1144   MCV 81 10/17/2022 1144   MCH 26.5 (L) 10/17/2022 1144   MCH 17.3 (L) 06/27/2015 1220   MCHC 32.6 10/17/2022 1144   MCHC 29.1 (L) 06/27/2015 1220   RDW 13.2 10/17/2022 1144   LYMPHSABS 2.0 10/17/2022 1144   MONOABS 0.4 06/27/2015 1220   EOSABS 0.0 10/17/2022 1144   BASOSABS 0.0 10/17/2022 1144    Lab Results  Component Value Date   HGBA1C 7.3 (A)  03/06/2024    Lab Results  Component Value Date   HGBA1C 7.3 (A) 03/06/2024   HGBA1C 6.3 06/06/2023   HGBA1C 6.3 10/17/2022       Assessment & Plan Type 1 diabetes mellitus with hypoglycemia without coma A1c increased from 6.3 to 7.3, indicating suboptimal glycemic control. Reports occasional hypoglycemia when not eating, suggesting need for better dietary management. - Continue Novolog  70/30 mix 13 units subcutaneously twice daily. - Advised carrying a snack at all times to prevent hypoglycemia. - Ordered blood work including cholesterol and A1c.  Essential hypertension associated with  type 1 diabetes mellitus Blood pressure is well-controlled with current medication regimen. Recent adjustment to lisinopril -hydrochlorothiazide  to  prevent hypotension. - Continue lisinopril -hydrochlorothiazide  10/12.5 mg oral daily. - Continue metoprolol  succinate 25 mg oral daily. - Refilled amlodipine  prescription.  General Health Maintenance Discussion about HPV vaccination status. Uncertain if received during childhood. - Check records for HPV vaccination status - [facility] shows she is not up to date - She would like to discuss HPV vaccination with mother.       Meds ordered this encounter  Medications   lisinopril -hydrochlorothiazide  (ZESTORETIC ) 10-12.5 MG tablet    Sig: Take 1 tablet by mouth daily.    Dispense:  90 tablet    Refill:  1    Discontinued 20/25 mg   Insulin  Asp Prot & Asp FlexPen (NOVOLOG  70/30 MIX) (70-30) 100 UNIT/ML FlexPen    Sig: Inject 13 Units into the skin 2 (two) times daily.    Dispense:  30 mL    Refill:  6    Dose decrease    Follow-up: Return in about 6 months (around 09/04/2024).       Corrina Sabin, MD, FAAFP. [facility] [place], [place] [phone]   03/06/2024, 12:13 PM     [1]  Allergies Allergen Reactions   Amlodipine  Other (See Comments)    headaches

## 2024-03-06 NOTE — Patient Instructions (Signed)
 VISIT SUMMARY:  Today, you had a routine follow-up appointment to check on your hypertension and diabetes, and you also had a foot exam. We discussed your current medications and any issues you have been experiencing.  YOUR PLAN:  -TYPE 1 DIABETES MELLITUS WITH HYPOGLYCEMIA: Your A1c level, which measures your average blood sugar over the past 3 months, has increased from 6.3% to 7.3%, indicating that your blood sugar is not as well controlled as it should be. You mentioned having occasional low blood sugar when you skip meals. Continue taking your Novolog  70/30 insulin  at 13 units twice daily. Always carry a snack with you to prevent low blood sugar episodes. We have also ordered blood work to check your cholesterol and A1c levels.  -ESSENTIAL HYPERTENSION: Your blood pressure is well-controlled with your current medications. Continue taking lisinopril -hydrochlorothiazide  10/12.5 mg daily, metoprolol  succinate 25 mg daily, and amlodipine  as prescribed. We have refilled your amlodipine  prescription.  -GENERAL HEALTH MAINTENANCE: We discussed your HPV vaccination status, and you are unsure if you received it during childhood. Please check your records or discuss with your mother to confirm if you have been vaccinated for HPV.  INSTRUCTIONS:  Please follow up with the blood work we ordered, including cholesterol and A1c tests. Also, check your records or talk to your mother about your HPV vaccination status.

## 2024-03-07 ENCOUNTER — Ambulatory Visit: Payer: Self-pay | Admitting: Family Medicine

## 2024-03-07 LAB — LP+NON-HDL CHOLESTEROL
Cholesterol, Total: 174 mg/dL (ref 100–199)
HDL: 65 mg/dL
LDL Chol Calc (NIH): 99 mg/dL (ref 0–99)
Total Non-HDL-Chol (LDL+VLDL): 109 mg/dL (ref 0–129)
Triglycerides: 47 mg/dL (ref 0–149)
VLDL Cholesterol Cal: 10 mg/dL (ref 5–40)

## 2024-03-07 LAB — CMP14+EGFR
ALT: 18 IU/L (ref 0–32)
AST: 26 IU/L (ref 0–40)
Albumin: 4.8 g/dL (ref 4.0–5.0)
Alkaline Phosphatase: 112 IU/L (ref 41–116)
BUN/Creatinine Ratio: 12 (ref 9–23)
BUN: 10 mg/dL (ref 6–20)
Bilirubin Total: 0.3 mg/dL (ref 0.0–1.2)
CO2: 26 mmol/L (ref 20–29)
Calcium: 10.3 mg/dL — ABNORMAL HIGH (ref 8.7–10.2)
Chloride: 99 mmol/L (ref 96–106)
Creatinine, Ser: 0.86 mg/dL (ref 0.57–1.00)
Globulin, Total: 2.9 g/dL (ref 1.5–4.5)
Glucose: 149 mg/dL — ABNORMAL HIGH (ref 70–99)
Potassium: 5.1 mmol/L (ref 3.5–5.2)
Sodium: 139 mmol/L (ref 134–144)
Total Protein: 7.7 g/dL (ref 6.0–8.5)
eGFR: 94 mL/min/1.73

## 2024-03-11 ENCOUNTER — Other Ambulatory Visit: Payer: Self-pay | Admitting: Family Medicine

## 2024-03-11 ENCOUNTER — Encounter: Payer: Self-pay | Admitting: Family Medicine

## 2024-03-11 MED ORDER — BENZONATATE 100 MG PO CAPS: 100.0000 mg | ORAL_CAPSULE | Freq: Three times a day (TID) | ORAL | 0 refills | Status: AC | PRN

## 2024-03-12 ENCOUNTER — Other Ambulatory Visit: Payer: Self-pay | Admitting: Family Medicine

## 2024-03-12 DIAGNOSIS — B9689 Other specified bacterial agents as the cause of diseases classified elsewhere: Secondary | ICD-10-CM

## 2024-03-12 MED ORDER — PSEUDOEPH-BROMPHEN-DM 30-2-10 MG/5ML PO SYRP: 5.0000 mL | ORAL_SOLUTION | Freq: Four times a day (QID) | ORAL | 0 refills | Status: AC | PRN

## 2024-03-13 ENCOUNTER — Telehealth: Admitting: Nurse Practitioner

## 2024-03-13 DIAGNOSIS — J0140 Acute pansinusitis, unspecified: Secondary | ICD-10-CM

## 2024-03-13 MED ORDER — FLUTICASONE PROPIONATE 50 MCG/ACT NA SUSP: 2.0000 | Freq: Every day | NASAL | 6 refills | Status: AC

## 2024-03-13 MED ORDER — AMOXICILLIN-POT CLAVULANATE 875-125 MG PO TABS: 1.0000 | ORAL_TABLET | Freq: Two times a day (BID) | ORAL | 0 refills | Status: AC

## 2024-03-13 NOTE — Progress Notes (Signed)
 " Virtual Visit Consent   Morgan Moon, you are scheduled for a virtual visit with a [facility] provider today. Just as with appointments in the office, your consent must be obtained to participate. Your consent will be active for this visit and any virtual visit you may have with one of our providers in the next 365 days. If you have a MyChart account, a copy of this consent can be sent to you electronically.  As this is a virtual visit, video technology does not allow for your provider to perform a traditional examination. This may limit your provider's ability to fully assess your condition. If your provider identifies any concerns that need to be evaluated in person or the need to arrange testing (such as labs, EKG, etc.), we will make arrangements to do so. Although advances in technology are sophisticated, we cannot ensure that it will always work on either your end or our end. If the connection with a video visit is poor, the visit may have to be switched to a telephone visit. With either a video or telephone visit, we are not always able to ensure that we have a secure connection.  By engaging in this virtual visit, you consent to the provision of healthcare and authorize for your insurance to be billed (if applicable) for the services provided during this visit. Depending on your insurance coverage, you may receive a charge related to this service.  I need to obtain your verbal consent now. Are you willing to proceed with your visit today? Morgan Moon has provided verbal consent on 03/13/2024 for a virtual visit (video or telephone). Lauraine Kitty, FNP  Date: 03/13/2024 5:02 PM   Virtual Visit via Video Note   I, Lauraine Kitty, connected with  Morgan Moon  ([id number], 1995-11-01) on 03/13/2024 at  5:15 PM EST by a video-enabled telemedicine application and verified that I am speaking with the correct person using two identifiers.  Location: Patient: Virtual Visit Location Patient:  Home Provider: Virtual Visit Location Provider: Home Office   I discussed the limitations of evaluation and management by telemedicine and the availability of in person appointments. The patient expressed understanding and agreed to proceed.    History of Present Illness: Morgan Moon is a 28 y.o. who identifies as a female who was assigned female at birth, and is being seen today for congestion.  She started to feel sick on 03/06/24 (7 days ago). She has had sinus congestion since that time, noted darker mucous production today when she blew her nose.  She has been using an over the counter decongestive without relief   She does have a cough and it is productive at times without respiratory distress  Denies a fever No history of asthma    HPI: Problems:  Patient Active Problem List   Diagnosis Date Noted   Vitamin D  deficiency 06/08/2023   B12 deficiency 06/08/2023   Anemia 07/23/2017   Family history of breast cancer 07/23/2017   Hypertension 07/23/2017   DM (diabetes mellitus), type 1, uncontrolled 06/30/2015   Vitiligo 06/30/2015   Systolic murmur 06/30/2015    Allergies: Allergies[1] Medications: Current Medications[2]  Observations/Objective: Patient is well-developed, well-nourished in no acute distress.  Resting comfortably  at home.  Head is normocephalic, atraumatic.  No labored breathing.  Speech is clear and coherent with logical content.  Patient is alert and oriented at baseline.    Assessment and Plan:  1. Acute non-recurrent pansinusitis (Primary) - amoxicillin -clavulanate (AUGMENTIN ) 875-125  MG tablet; Take 1 tablet by mouth 2 (two) times daily for 7 days.  Dispense: 14 tablet; Refill: 0 - fluticasone  (FLONASE ) 50 MCG/ACT nasal spray; Place 2 sprays into both nostrils daily.  Dispense: 16 g; Refill: 6   Push fluids, may continue to use over the counter medicine for relief   Follow Up Instructions: I discussed the assessment and treatment plan with  the patient. The patient was provided an opportunity to ask questions and all were answered. The patient agreed with the plan and demonstrated an understanding of the instructions.  A copy of instructions were sent to the patient via MyChart unless otherwise noted below.    The patient was advised to call back or seek an in-person evaluation if the symptoms worsen or if the condition fails to improve as anticipated.    Lauraine Kitty, FNP     [1]  Allergies Allergen Reactions   Amlodipine  Other (See Comments)    headaches  [2]  Current Outpatient Medications:    Accu-Chek FastClix Lancets MISC, USE TO TEST UP TO THREE TIMES DAILY AS DIRECTED, Disp: 204 each, Rfl: 0   benzonatate  (TESSALON ) 100 MG capsule, Take 1 capsule (100 mg total) by mouth 3 (three) times daily as needed for cough., Disp: 15 capsule, Rfl: 0   Blood Glucose Monitoring Suppl (ACCU-CHEK GUIDE) w/Device KIT, Use to test blood sugar, Disp: 1 kit, Rfl: 0   brompheniramine-pseudoephedrine-DM 30-2-10 MG/5ML syrup, Take 5 mLs by mouth 4 (four) times daily as needed., Disp: 120 mL, Rfl: 0   Clindamycin -Benzoyl Per, Refr, gel, Apply 1 Application topically 2 (two) times daily., Disp: 45 g, Rfl: 0   Continuous Blood Gluc Receiver (FREESTYLE LIBRE 2 READER) DEVI, Use to check blood sugar TID. E10.649 (Patient not taking: Reported on 03/06/2024), Disp: 1 each, Rfl: 0   Continuous Blood Gluc Sensor (FREESTYLE LIBRE 2 SENSOR) MISC, Use to check blood sugar TID. Change sensors once every 14 days. E10.649 (Patient not taking: Reported on 03/06/2024), Disp: 2 each, Rfl: 3   Cyanocobalamin  1000 MCG CAPS, Take 1 capsule by mouth daily., Disp: 90 capsule, Rfl: 1   ergocalciferol  (DRISDOL ) 1.25 MG (50000 UT) capsule, Take 1 capsule (50,000 Units total) by mouth once a week., Disp: 12 capsule, Rfl: 0   fluticasone  (FLONASE ) 50 MCG/ACT nasal spray, Place 2 sprays into both nostrils daily., Disp: 16 g, Rfl: 0   glucose blood (ACCU-CHEK GUIDE  TEST) test strip, Use as instructed, Disp: 100 strip, Rfl: 2   ibuprofen  (ADVIL ) 600 MG tablet, Take 1 tablet (600 mg total) by mouth every 8 (eight) hours as needed. Take with food., Disp: 30 tablet, Rfl: 0   Insulin  Asp Prot & Asp FlexPen (NOVOLOG  70/30 MIX) (70-30) 100 UNIT/ML FlexPen, Inject 13 Units into the skin 2 (two) times daily., Disp: 30 mL, Rfl: 6   Insulin  Aspart FlexPen (NOVOLOG ) 100 UNIT/ML, ADMINISTER 0 TO 12 UNITS UNDER THE SKIN THREE TIMES DAILY WITH MEALS. MAX DOSE 36 UNITS. AS PER SLIDING SCALE, Disp: 15 mL, Rfl: 0   Insulin  Pen Needle (DROPLET PEN NEEDLES) 32G X 4 MM MISC, USE TO INJECT INSULIN  A TOTAL OF 5 TIMES DAILY, Disp: 100 each, Rfl: 0   Insulin  Syringe-Needle U-100 (BD INSULIN  SYRINGE ULTRAFINE) 31G X 15/64 0.5 ML MISC, Use as directed twice daily., Disp: 300 each, Rfl: 3   Iron , Ferrous Sulfate , 325 (65 Fe) MG TABS, Take 325 mg by mouth daily., Disp: 60 tablet, Rfl: 6   lidocaine  (XYLOCAINE ) 2 % solution,  Use as directed 15 mLs in the mouth or throat every 6 (six) hours as needed for mouth pain., Disp: 100 mL, Rfl: 0   lisinopril -hydrochlorothiazide  (ZESTORETIC ) 10-12.5 MG tablet, Take 1 tablet by mouth daily., Disp: 90 tablet, Rfl: 1   metoprolol  succinate (TOPROL -XL) 25 MG 24 hr tablet, TAKE 1 TABLET(25 MG) BY MOUTH DAILY, Disp: 90 tablet, Rfl: 1   Misc. Devices MISC, Blood pressure monitor.  Diagnosis hypertension, Disp: 1 each, Rfl: 0   Nutritional Supplements (GLUCERNA 1.0 CAL) LIQD, Take 1 each by mouth 3 (three) times daily as needed. Strawberry or vanilla, Disp: 1500 mL, Rfl: 1   ondansetron  (ZOFRAN -ODT) 4 MG disintegrating tablet, DISSOLVE 1 TABLET(4 MG) ON THE TONGUE EVERY 8 HOURS AS NEEDED FOR NAUSEA OR VOMITING, Disp: 30 tablet, Rfl: 1  "

## 2024-03-21 ENCOUNTER — Other Ambulatory Visit: Payer: Self-pay | Admitting: Family Medicine

## 2024-03-21 DIAGNOSIS — E10649 Type 1 diabetes mellitus with hypoglycemia without coma: Secondary | ICD-10-CM

## 2024-04-04 ENCOUNTER — Other Ambulatory Visit: Payer: Self-pay | Admitting: Family Medicine

## 2024-04-09 ENCOUNTER — Encounter: Payer: Self-pay | Admitting: Family Medicine
